# Patient Record
Sex: Male | Born: 1995 | Race: White | Hispanic: No | Marital: Single | Smoking: Never smoker
Health system: Southern US, Community
[De-identification: ages and names within clinical notes are randomized; demographics above are authoritative.]

---
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
## 2019-07-06 DIAGNOSIS — Z20828 Contact with and (suspected) exposure to other viral communicable diseases: Secondary | ICD-10-CM | POA: Diagnosis not present

## 2019-07-20 DIAGNOSIS — Z20828 Contact with and (suspected) exposure to other viral communicable diseases: Secondary | ICD-10-CM | POA: Diagnosis not present

## 2019-10-18 DIAGNOSIS — L700 Acne vulgaris: Secondary | ICD-10-CM | POA: Diagnosis not present

## 2019-10-20 DIAGNOSIS — Z20828 Contact with and (suspected) exposure to other viral communicable diseases: Secondary | ICD-10-CM | POA: Diagnosis not present

## 2019-10-20 DIAGNOSIS — Z6825 Body mass index (BMI) 25.0-25.9, adult: Secondary | ICD-10-CM | POA: Diagnosis not present

## 2019-11-12 ENCOUNTER — Other Ambulatory Visit (HOSPITAL_COMMUNITY)
Admission: RE | Admit: 2019-11-12 | Discharge: 2019-11-12 | Disposition: A | Discharge status: Home / Self Care | Payer: BC Managed Care – PPO | Source: Ambulatory Visit | Attending: Adult Health | Admitting: Adult Health

## 2019-11-12 ENCOUNTER — Encounter: Payer: Self-pay | Admitting: Adult Health

## 2019-11-12 ENCOUNTER — Other Ambulatory Visit: Payer: Self-pay

## 2019-11-12 ENCOUNTER — Ambulatory Visit (INDEPENDENT_AMBULATORY_CARE_PROVIDER_SITE_OTHER): Payer: BC Managed Care – PPO | Admitting: Adult Health

## 2019-11-12 VITALS — BP 118/84 | HR 76 | Temp 97.5°F | Resp 16 | Ht 63.0 in | Wt 147.2 lb

## 2019-11-12 DIAGNOSIS — Z113 Encounter for screening for infections with a predominantly sexual mode of transmission: Secondary | ICD-10-CM | POA: Insufficient documentation

## 2019-11-12 DIAGNOSIS — H04129 Dry eye syndrome of unspecified lacrimal gland: Secondary | ICD-10-CM | POA: Diagnosis not present

## 2019-11-12 DIAGNOSIS — R5383 Other fatigue: Secondary | ICD-10-CM | POA: Diagnosis not present

## 2019-11-12 DIAGNOSIS — Z1329 Encounter for screening for other suspected endocrine disorder: Secondary | ICD-10-CM | POA: Diagnosis not present

## 2019-11-12 NOTE — Patient Instructions (Addendum)
Recommend dilated eye exam - at ophthalmologist list  of your choice. [facility] is in  if needed and dry eyes persist.   Dry Eye  Dry eye, also called keratoconjunctivitis sicca, is dryness of the membranes surrounding the eye. It happens when there are not enough healthy, natural tears in the eyes. The eyes must remain moist at all times. A small amount of tears is constantly produced by the tear glands (lacrimal glands). These glands are located under the outside part of the upper eyelids. Dry eye can happen on its own or be a symptom of several conditions, such as rheumatoid arthritis, lupus, or Sjgren's syndrome. Dry eye may be mild to severe. What are the causes? This condition may be caused by:  Not making enough tears (aqueous tear-deficient dry eyes).  Tears evaporating from the eyes too quickly (evaporative dry eyes). This is when there is an abnormality in the quality of your tears. This abnormality causes your tears to evaporate so quickly that the eyes cannot be kept moist. What increases the risk? You are more likely to develop this condition if you:  Are a woman, especially if you have gone through menopause.  Live in a dry climate.  Live in a dusty or smoky area.  Take certain medicines, such as: ? Anti-allergy medicines (antihistamines). ? Blood pressure medicines (antihypertensives). ? Birth control pills (oral contraceptives). ? Laxatives. ? Tranquilizers.  Have a history of refractive eye surgery, such as LASIK.  Have a history of long-term contact lens use. What are the signs or symptoms? Symptoms of this condition include:  Irritation.  Itchiness.  Redness.  Burning.  Inflammation of the eyelids.  Feeling as though something is stuck in the eye.  Light sensitivity.  Increased sensitivity and discomfort when wearing contact lenses.  Vision that varies throughout the day.  Occasional excessive tearing. How is this  diagnosed? This condition is diagnosed based on your symptoms, your medical history, and an eye exam.  Your health care provider may look at your eye using a microscope and may put dyes in your eye to check the health of the surface of your eye.  You may have tests, such as a test to evaluate your tear production (Schirmer test). ? During this test, a small strip of special paper is gently pressed into the inner corner of your eye. ? Your tear production is measured by how much of the paper is moistened by your tears during a set amount of time. You may be referred to a health care provider who specializes in eyes and eyesight (ophthalmologist). How is this treated? Treatment for this condition depends on the severity. Mild cases are often treated at home. To help relieve your symptoms, your health care provider may recommend eye drops, which are also called artificial tears.  If your condition is severe, treatment may include: ? Prescription eye drops. ? Over-the-counter or prescription ointments to moisten your eyes. ? Minor surgery to place plugs into the tear ducts. This keep tears from draining so that tears can stay on the surface of the eye longer. ? Medicines to reduce inflammation of the eyelids. ? Taking an omega-3 fatty acid nutritional supplement. Follow these instructions at home:  Take or apply over-the-counter and prescription medicines only as told by your health care provider. This includes eye drops.  If directed, apply a warm compress to your eyes to help reduce inflammation. Place a towel over your eyes and gently press the warm compress over your  eyes for about 5 minutes, or as long as told by your health care provider.  Drink plenty of fluids to stay well hydrated.  If possible, avoid dry, drafty environments.  Wear sunglasses when outdoors to protect your eyes from the sun and wind.  Use a humidifier at home to increase moisture in the air.  Remember to blink  often when reading or using the computer for long periods.  If you wear contact lenses, remove them regularly to give your eyes a break. Always remove contacts before sleeping.  Have a yearly eye exam and vision test.  Keep all follow-up visits as told by your health care provider. This is important. Contact a health care provider if:  You have eye pain.  You have pus-like fluid coming from your eye.  Your symptoms get worse or do not improve with treatment. Get help right away if:  Your vision suddenly changes. Summary  Dry eye is dryness of the membranes surrounding the eye.  Dry eye can happen on its own or be a symptom of several conditions, such as rheumatoid arthritis, lupus, or Sjgren's syndrome.  This condition is diagnosed based on your symptoms, your medical history, and an eye exam.  Treatment for this condition depends on the severity. Mild cases are often treated at home. To help relieve your symptoms, your health care provider may recommend eye drops, which are also called artificial tears. This information is not intended to replace advice given to you by your health care provider. Make sure you discuss any questions you have with your health care provider. Document Revised: 04/07/2018 Document Reviewed: 04/07/2018 Elsevier Patient Education  2020 [employer] Artificial Tears eye solution What is this medicine? ARTIFICIAL TEARS (ahr tuh FISH uhl teerz) eye solution soothes irritation and discomfort caused by dry eyes. This medicine may be used for other purposes; ask your health care provider or pharmacist if you have questions. COMMON BRAND NAME(S): Advanced Eye Relief, Akwa Tears, Akwa Tears Renewed, Artificial Tears, Bion Tears, Blink Tears, Clear eyes, Clear eyes Outdoor Dry Eye Protection, FreshKote, GenTeal Mild, GenTeal Moderate, GenTeal PF, Gonak, Goniosoft, Hypo Tears, Isopto Tears, LiquiTears, Lubricating Plus, Moisture Eyes, Moisture Eyes Preservative  Free, Murine, Natural Balance Tears, Nature's Tears, Opti-Free, Puralube Tears, Refresh, Refresh Celluvisc, Refresh Contacts Comfort, Refresh Endura, Refresh Optive, Refresh Optive Sensitive, Refresh Plus, Refresh RELIEVA, Refresh Tears, Retaine CMC, Systane Balance, Systane Complete, Teargen, Tears Naturale Forte, Tears Naturale II, Tears Renewed, TheraTears, Visine Advanced, Visine Dry Eye Relief, Visine Pure Tears, Visine Tears, Visine Tired Eye Relief, Viva What should I tell my health care provider before I take this medicine?  change in vision  eye infection or trauma  wear contact lenses  an unusual or allergic reaction to artificial tears, other medicines, foods, dyes, or preservatives  pregnant or trying to get pregnant  breast-feeding How should I use this medicine? This medicine is only for use in the eye. Do not take by mouth. Follow the directions on the label. Wash hands before and after use. Tilt the head back slightly and pull down the lower eyelid with your index finger to form a pouch. Try not to touch the tip of the dropper to your eye, fingertips, or any other surface. Squeeze the prescribed number of drops (usually one or two drops) into the pouch. Close the eye gently for a few moments to allow the drops to be in contact with the eye. Use your medicine at regular intervals. Do not use your medicine more  often than directed. Talk to your pediatrician regarding the use of this medicine in children. While this medicine may be used in children as young as 6 years for selected conditions, precautions do apply. Overdosage: If you think you have taken too much of this medicine contact a poison control center or emergency room at once. NOTE: This medicine is only for you. Do not share this medicine with others. What if I miss a dose? If you miss a dose, use it as soon as you can. If it is almost time for your next dose, use only that dose. Do not use double or extra doses. What may  interact with this medicine? Interactions are not expected. If you are using other eye drops with this medicine, separate the application of the different eye drops by roughly 5 minutes. This ensures that the eye drops do not interfere with each other. If you are using both eye drops and an eye ointment, use the eye drops 10 minutes before the eye ointment so that the eye ointment does not interfere with the action of the drops. This list may not describe all possible interactions. Give your health care provider a list of all the medicines, herbs, non-prescription drugs, or dietary supplements you use. Also tell them if you smoke, drink alcohol, or use illegal drugs. Some items may interact with your medicine. What should I watch for while using this medicine? If you experience eye pain, changes in vision, continued redness or irritation of the eye, or if your eye condition gets worse or lasts longer than 72 hours, discontinue use and consult your health care professional. To avoid contamination of this product, do not touch the tip of the container to any surface. Do not share this medicine with others. If the product changes color or becomes cloudy, do not use. If you wear contact lenses, you should remove them before putting the drops in your eyes. Wait at least 15 minutes after putting the drops in your eyes before putting your contact lenses back in. What side effects may I notice from receiving this medicine? Side effects that you should report to your doctor or health care professional as soon as possible:  allergic reactions like skin rash, itching or hives, swelling of the face, lips, or tongue  change in vision  eye irritation or redness that gets worse or lasts more than 72 hours  eye pain Side effects that usually do not require medical attention (report to your doctor or health care professional if they continue or are bothersome):  temporary stinging or blurred vision when applying  the eye drops This list may not describe all possible side effects. Call your doctor for medical advice about side effects. You may report side effects to FDA at 1-800-FDA-1088. Where should I keep my medicine? Keep out of the reach of children. Store at room temperature between 15 and 30 degrees C (59 and 86 degrees F). Do not freeze. Throw away any unused medicine after the expiration date. Once the product is opened, most experts recommend discarding the product after 30 days. NOTE: This sheet is a summary. It may not cover all possible information. If you have questions about this medicine, talk to your doctor, pharmacist, or health care provider.  2020 Elsevier/Gold Standard (2008-04-15 14:24:03) Health Maintenance, Male Adopting a healthy lifestyle and getting preventive care are important in promoting health and wellness. Ask your health care provider about:  The right schedule for you to have regular tests and exams.  Things you can do on your own to prevent diseases and keep yourself healthy. What should I know about diet, weight, and exercise? Eat a healthy diet   Eat a diet that includes plenty of vegetables, fruits, low-fat dairy products, and lean protein.  Do not eat a lot of foods that are high in solid fats, added sugars, or sodium. Maintain a healthy weight Body mass index (BMI) is a measurement that can be used to identify possible weight problems. It estimates body fat based on height and weight. Your health care provider can help determine your BMI and help you achieve or maintain a healthy weight. Get regular exercise Get regular exercise. This is one of the most important things you can do for your health. Most adults should:  Exercise for at least 150 minutes each week. The exercise should increase your heart rate and make you sweat (moderate-intensity exercise).  Do strengthening exercises at least twice a week. This is in addition to the moderate-intensity  exercise.  Spend less time sitting. Even light physical activity can be beneficial. Watch cholesterol and blood lipids Have your blood tested for lipids and cholesterol at 24 years of age, then have this test every 5 years. You may need to have your cholesterol levels checked more often if:  Your lipid or cholesterol levels are high.  You are older than 24 years of age.  You are at high risk for heart disease. What should I know about cancer screening? Many types of cancers can be detected early and may often be prevented. Depending on your health history and family history, you may need to have cancer screening at various ages. This may include screening for:  Colorectal cancer.  Prostate cancer.  Skin cancer.  Lung cancer. What should I know about heart disease, diabetes, and high blood pressure? Blood pressure and heart disease  High blood pressure causes heart disease and increases the risk of stroke. This is more likely to develop in people who have high blood pressure readings, are of African descent, or are overweight.  Talk with your health care provider about your target blood pressure readings.  Have your blood pressure checked: ? Every 3-5 years if you are 3618-24 years of age. ? Every year if you are 24 years old or older.  If you are between the ages of 3865 and 6675 and are a current or former smoker, ask your health care provider if you should have a one-time screening for abdominal aortic aneurysm (AAA). Diabetes Have regular diabetes screenings. This checks your fasting blood sugar level. Have the screening done:  Once every three years after age 24 if you are at a normal weight and have a low risk for diabetes.  More often and at a younger age if you are overweight or have a high risk for diabetes. What should I know about preventing infection? Hepatitis B If you have a higher risk for hepatitis B, you should be screened for this virus. Talk with your health care  provider to find out if you are at risk for hepatitis B infection. Hepatitis C Blood testing is recommended for:  Everyone born from [id number] through 1965.  Anyone with known risk factors for hepatitis C. Sexually transmitted infections (STIs)  You should be screened each year for STIs, including gonorrhea and chlamydia, if: ? You are sexually active and are younger than 24 years of age. ? You are older than 24 years of age and your health care provider tells you  that you are at risk for this type of infection. ? Your sexual activity has changed since you were last screened, and you are at increased risk for chlamydia or gonorrhea. Ask your health care provider if you are at risk.  Ask your health care provider about whether you are at high risk for HIV. Your health care provider may recommend a prescription medicine to help prevent HIV infection. If you choose to take medicine to prevent HIV, you should first get tested for HIV. You should then be tested every 3 months for as long as you are taking the medicine. Follow these instructions at home: Lifestyle  Do not use any products that contain nicotine or tobacco, such as cigarettes, e-cigarettes, and chewing tobacco. If you need help quitting, ask your health care provider.  Do not use street drugs.  Do not share needles.  Ask your health care provider for help if you need support or information about quitting drugs. Alcohol use  Do not drink alcohol if your health care provider tells you not to drink.  If you drink alcohol: ? Limit how much you have to 0-2 drinks a day. ? Be aware of how much alcohol is in your drink. In the [place] one drink equals one 12 oz bottle of beer (355 mL), one 5 oz glass of wine (148 mL), or one 1 oz glass of hard liquor (44 mL). General instructions  Schedule regular health, dental, and eye exams.  Stay current with your vaccines.  Tell your health care provider if: ? You often feel depressed. ? You  have ever been abused or do not feel safe at home. Summary  Adopting a healthy lifestyle and getting preventive care are important in promoting health and wellness.  Follow your health care provider's instructions about healthy diet, exercising, and getting tested or screened for diseases.  Follow your health care provider's instructions on monitoring your cholesterol and blood pressure. This information is not intended to replace advice given to you by your health care provider. Make sure you discuss any questions you have with your health care provider. Document Revised: 10/07/2018 Document Reviewed: 10/07/2018 Elsevier Patient Education  [place]. Testicular Self-Exam A self-exam of your testicles (testicular self-exam) is looking at and feeling your testicles for unusual lumps or swelling. Swelling, lumps, or pain can be caused by:  Injuries.  Puffiness, redness, and soreness (inflammation).  Infection.  Extra fluids around your testicle (hydrocele).  Twisted testicles (testicular torsion).  Cancer of the testicle (testicular cancer). Why is it important to do a self-exam of testicles? You may need to do self-exams if you are at risk for cancer of the testicles. You may be at risk if you have:  A testicle that has not descended (cryptorchidism).  A history of cancer of the testicle.  A family history of cancer of the testicle. How to do a self-exam of testicles It is easiest to do a self-exam after a warm bath or shower. Testicles are harder to examine when you are cold. A normal testicle is egg-shaped and feels firm. It is smooth, and it is not tender. At the back of your testicles, there is a firm cord that feels like spaghetti (spermatic cord). Look and feel for changes  Stand and hold your penis away from your body.  Look at each testicle to check for lumps or swelling.  Roll each testicle between your thumb and finger. Feel the whole testicle. Feel  for: ? Lumps. ? Swelling. ?  Discomfort.  Check for swelling or tender bumps in the groin area. Your groin is where your lower belly (abdomen) meets your upper thighs. Contact a health care provider if:  You find a bump or lump. This may be like a small, hard bump that is the size of a pea.  You find swelling.  You find pain.  You find soreness.  You see or feel any other changes. Summary  A self-exam of your testicles is looking at and feeling your testicles for lumps or swelling.  You may need to do self-exams if you are at risk for cancer of the testicle.  You should check each of your testicles for lumps, swelling, or discomfort.  You should check for swelling or tender bumps in the groin area. Your groin is where your lower belly (abdomen) meets your upper thighs. This information is not intended to replace advice given to you by your health care provider. Make sure you discuss any questions you have with your health care provider. Document Revised: 02/04/2019 Document Reviewed: 09/09/2016 Elsevier Patient Education  2020 [employer].

## 2019-11-12 NOTE — Progress Notes (Signed)
Patient: Christopher Lyons, Male    DOB: 12-17-1995, 24 y.o.   MRN: [id number] Visit Date: 11/12/2019  Today's Provider: Jairo Ben, FNP   Chief Complaint  Patient presents with  . New Patient (Initial Visit)   Subjective:    New patient Christopher Lyons is a 24 y.o. male who presents today for health maintenance and establish care He feels well, patient would like to address today intermittent eye pain that is affecting him at night. Patient states that he looks at a computer screen for long periods of time and recently switched to blue light to see if it would help with his eyes but he reports pain still is present after prolonged time on computer.Marland Kitchen He reports exercising. He reports he is sleeping well, patient did state that if he does not get exactly 8 hrs of sleep a night then he will experience fatigue in the following day and patient wanted to discuss why he was experiencing this.   He reports with 6-7 hours of sleep he is too tired he has to have 8 hours and feels good. Goes to bed 9pm and wakes at 5 am. He reports he started drinking coffee and this has helped.   He has had "burning " feeling his eyes mostly when looking  at phones/ screens. . Does not wear contacts or glasses. He has not been seen by eye doctor. He has not tried any treatments. He is unable to reduce screen time. He did purchase blue blocker glasses and switched to night mode on devices. Denies any drainage, pain , vision change. Denies itching eyes.  He denies any eye injury or trauma.  Denies any unusual sensations in his eyes.  Denies grittiness.  He is an active runner, he eats well. He is very active. Tries to maintain a healthy diet , smoothies, salads and small meals daily. Runs 5 x week at least.   Increased urination, has increased fluids and started caffeine recently and noticed this after starting that.  Denies any dysuria or symptoms.  Denies hematuria.  History was reviewed with patient, he  does have history of bilateral lower calf pains but that was with running, this has resolved.  He was seen previously years ago for this.  Denies any pain. He would like to be tested for STD's.  He denies any specific exposures or concerns.  Denies any symptoms.  He works in Airline pilot at [employer]. He manages his stress well he reports.  Otherwise he feels well, is happy.  Denies any suicidal or homicidal ideations or intents.  Patient  denies any fever, body aches,chills, rash, chest pain, shortness of breath, nausea, vomiting, or diarrhea.   -----------------------------------------------------------------   Review of Systems  Constitutional: Positive for fatigue (when not getting exactly 8 hours of sleep. now drinking two cups of caffiene daily and feels less fatigue. ). Negative for activity change, appetite change, chills, diaphoresis, fever and unexpected weight change.       Did test positive for covid in 2019 September.   HENT: Negative.   Eyes: Negative for photophobia, pain, discharge, redness, itching and visual disturbance.       Dryness bilaterally. Has not tried any treatment. Has bought blue blocker glasses . He has lots of screen time at work.  Never has had an eye exam he reports. He does not have any reported vision change, or pain in eyes.   Respiratory: Negative.   Cardiovascular: Negative.   Gastrointestinal: Negative.  Endocrine: Positive for polyuria. Negative for cold intolerance, heat intolerance, polydipsia and polyphagia.  Genitourinary: Negative.   Musculoskeletal: Negative.   Skin: Negative.   Allergic/Immunologic: Positive for environmental allergies. Negative for food allergies and immunocompromised state.  Neurological: Negative.  Negative for dizziness, tremors, seizures, syncope, facial asymmetry, speech difficulty, weakness, light-headedness, numbness and headaches.  Hematological: Negative.   Psychiatric/Behavioral: Negative.     Social History He   Social  History   Socioeconomic History  . Marital status: Single    Spouse name: Not on file  . Number of children: Not on file  . Years of education: Not on file  . Highest education level: Not on file  Occupational History  . Not on file  Tobacco Use  . Smoking status: Not on file  Substance and Sexual Activity  . Alcohol use: Not on file  . Drug use: Not on file  . Sexual activity: Not on file  Other Topics Concern  . Not on file  Social History Narrative  . Not on file   Social Determinants of Health   Financial Resource Strain:   . Difficulty of Paying Living Expenses: Not on file  Food Insecurity:   . Worried About Programme researcher, broadcasting/film/videounning Out of Food in the Last Year: Not on file  . Ran Out of Food in the Last Year: Not on file  Transportation Needs:   . Lack of Transportation (Medical): Not on file  . Lack of Transportation (Non-Medical): Not on file  Physical Activity:   . Days of Exercise per Week: Not on file  . Minutes of Exercise per Session: Not on file  Stress:   . Feeling of Stress : Not on file  Social Connections:   . Frequency of Communication with Friends and Family: Not on file  . Frequency of Social Gatherings with Friends and Family: Not on file  . Attends Religious Services: Not on file  . Active Member of Clubs or Organizations: Not on file  . Attends BankerClub or Organization Meetings: Not on file  . Marital Status: Not on file   Patient Active Problem List   Diagnosis Date Noted  . Fatigue 11/12/2019  . Screening for thyroid disorder 11/12/2019  . Eye dryness 11/12/2019  . Peroneus brevis tendinitis, right 11/30/2014  . Right foot pain 11/30/2014  . Muscle tightness 07/06/2014  . Bilateral calf pain 07/06/2014  . Decreased range of motion of ankle 07/06/2014    Family History  No family status information on file.   His family history is not on file.     No Known Allergies  Previous Medications   No medications on file    Patient Care Team: Flinchum,  Eula FriedMichelle S, FNP as PCP - General (Family Medicine)      Objective:    Physical Exam Vitals and nursing note reviewed.  Constitutional:      General: He is not in acute distress.    Appearance: Normal appearance. He is well-developed and normal weight. He is not ill-appearing, toxic-appearing or diaphoretic.     Comments: Patient is alert and oriented and responsive to questions Engages in eye contact with provider. Speaks in full sentences without any pauses without any shortness of breath or distress.    HENT:     Head: Normocephalic and atraumatic.     Right Ear: Hearing, tympanic membrane, ear canal and external ear normal.     Left Ear: Hearing, tympanic membrane, ear canal and external ear normal.     Nose:  Nose normal.     Mouth/Throat:     Pharynx: Uvula midline. No oropharyngeal exudate.  Eyes:     General: Lids are normal. Lids are everted, no foreign bodies appreciated. Vision grossly intact. Gaze aligned appropriately. No allergic shiner, visual field deficit or scleral icterus.       Right eye: No discharge.        Left eye: No discharge.     Extraocular Movements: Extraocular movements intact.     Conjunctiva/sclera: Conjunctivae normal.     Right eye: Right conjunctiva is not injected. No chemosis, exudate or hemorrhage.    Left eye: Left conjunctiva is not injected. No chemosis, exudate or hemorrhage.    Pupils: Pupils are equal, round, and reactive to light.     Right eye: Pupil is round, reactive and not sluggish.     Left eye: Pupil is round, reactive and not sluggish.     Funduscopic exam:    Right eye: No hemorrhage, exudate, AV nicking or papilledema. Red reflex present.        Left eye: No hemorrhage, exudate or papilledema. Red reflex and venous pulsations present.    Slit lamp exam:    Right eye: No photophobia.     Left eye: No photophobia.     Comments: Vision exam showed 20/15   Neck:     Thyroid: No thyromegaly.     Vascular: Normal carotid pulses.  No carotid bruit, hepatojugular reflux or JVD.     Trachea: Trachea and phonation normal. No tracheal tenderness or tracheal deviation.     Meningeal: Brudzinski's sign absent.  Cardiovascular:     Rate and Rhythm: Normal rate and regular rhythm.     Pulses: Normal pulses.     Heart sounds: Normal heart sounds, S1 normal and S2 normal. Heart sounds not distant. No murmur. No friction rub. No gallop.   Pulmonary:     Effort: Pulmonary effort is normal. No accessory muscle usage or respiratory distress.     Breath sounds: Normal breath sounds. No stridor. No wheezing or rales.  Chest:     Chest wall: No tenderness.  Abdominal:     General: Bowel sounds are normal. There is no distension.     Palpations: Abdomen is soft. There is no mass.     Tenderness: There is no abdominal tenderness. There is no right CVA tenderness, left CVA tenderness, guarding or rebound.     Hernia: No hernia is present.  Genitourinary:    Comments: Deferred declined, patient has no symptoms or concerns. Musculoskeletal:        General: No tenderness or deformity. Normal range of motion.     Cervical back: Full passive range of motion without pain, normal range of motion and neck supple.     Comments: Patient moves on and off of exam table and in room without difficulty. Gait is normal in hall and in room. Patient is oriented to person place time and situation. Patient answers questions appropriately and engages in conversation.   Lymphadenopathy:     Head:     Right side of head: No submental, submandibular, tonsillar, preauricular, posterior auricular or occipital adenopathy.     Left side of head: No submental, submandibular, tonsillar, preauricular, posterior auricular or occipital adenopathy.     Cervical: No cervical adenopathy.  Skin:    General: Skin is warm and dry.     Capillary Refill: Capillary refill takes less than 2 seconds.     Coloration: Skin is  not pale.     Findings: No erythema or rash.      Nails: There is no clubbing.  Neurological:     Mental Status: He is alert and oriented to person, place, and time.     GCS: GCS eye subscore is 4. GCS verbal subscore is 5. GCS motor subscore is 6.     Cranial Nerves: No cranial nerve deficit.     Sensory: No sensory deficit.     Motor: No abnormal muscle tone.     Coordination: Coordination normal.     Gait: Gait normal.     Deep Tendon Reflexes: Reflexes are normal and symmetric. Reflexes normal.  Psychiatric:        Speech: Speech normal.        Behavior: Behavior normal.        Thought Content: Thought content normal.        Judgment: Judgment normal.      Depression Screen PHQ 2/9 Scores 11/12/2019  PHQ - 2 Score 0  PHQ- 9 Score 4    Depression screen PHQ 2/9 11/12/2019  Decreased Interest 0  Down, Depressed, Hopeless 0  PHQ - 2 Score 0  Altered sleeping 2  Tired, decreased energy 2  Change in appetite 0  Feeling bad or failure about yourself  0  Trouble concentrating 0  Moving slowly or fidgety/restless 0  Suicidal thoughts 0  PHQ-9 Score 4  Difficult doing work/chores Not difficult at all     Assessment & Plan:     Routine Health Maintenance and Physical Exam  Exercise Activities and Dietary recommendations Goals   None      There is no immunization history on file for this patient.  Health Maintenance  Topic Date Due  . HIV Screening  09/06/2011  . TETANUS/TDAP  09/06/2015  . INFLUENZA VACCINE  05/29/2019   Yearly eye exam and dental exam recommended.   Discussed health benefits of physical activity, and encouraged him to engage in regular exercise appropriate for his age and condition.    Fatigue, unspecified type - Plan: HIV screening, Urine cytology ancillary only, CBC with Differential/Platelet 005009, Comprehensive Metabolic Panel (CMET), TSH  Screening for STD (sexually transmitted disease) - Plan: HIV screening, RPR, Urine cytology ancillary only, TSH  Screening for thyroid disorder -  Plan: TSH  Eye dryness - Plan: CBC with Differential/Platelet 005009, Comprehensive Metabolic Panel (CMET), TSH  Recommend artificial tears/ lubrication eye drops per package instructions. Eye exam at ophthalmologist. Discussed RED FLAGS of eye pain, vision change or loss and when to seek care immediately. Decrease screen time if able. He works on Arts administrator a lot.  Screening for UTI's.  Discussed diuretic effect of caffeine. Also discussed urinary signs to return to clinic for.  Will check labs for fatigue.  Recommend sleep hygiene.  Recommend  Yearly physical and return PRN.   Return if symptoms worsen or fail to improve, for at any time for any worsening symptoms, Go to Emergency room/ urgent care if worse. Advised patient call the office or your primary care doctor for an appointment if no improvement within 72 hours or if any symptoms change or worsen at any time  Advised ER or urgent Care if after hours or on weekend. Call 911 for emergency symptoms at any time.Patinet verbalized understanding of all instructions given/reviewed and treatment plan and has no further questions or concerns at this time.    The entirety of the information documented in the History of Present Illness, Review  of Systems and Physical Exam were personally obtained by me. Portions of this information were initially documented by the  Certified Medical Assistant whose name is documented in Epic and reviewed by me for thoroughness and accuracy.  I have personally performed the exam and reviewed the chart and it is accurate to the best of my knowledge.  Museum/gallery conservator has been used and any errors in dictation or transcription are unintentional.  Eula Fried. Flinchum FNP-C  [facility]  --------------------------------------------------------------------

## 2019-11-13 ENCOUNTER — Encounter: Payer: Self-pay | Admitting: Adult Health

## 2019-11-14 ENCOUNTER — Encounter: Payer: Self-pay | Admitting: Adult Health

## 2019-11-15 ENCOUNTER — Other Ambulatory Visit: Payer: Self-pay

## 2019-11-15 DIAGNOSIS — Z1329 Encounter for screening for other suspected endocrine disorder: Secondary | ICD-10-CM

## 2019-11-15 DIAGNOSIS — R5383 Other fatigue: Secondary | ICD-10-CM

## 2019-11-15 DIAGNOSIS — H04129 Dry eye syndrome of unspecified lacrimal gland: Secondary | ICD-10-CM

## 2019-11-15 DIAGNOSIS — Z113 Encounter for screening for infections with a predominantly sexual mode of transmission: Secondary | ICD-10-CM

## 2019-11-16 ENCOUNTER — Encounter: Payer: Self-pay | Admitting: Adult Health

## 2019-11-16 LAB — URINE CYTOLOGY ANCILLARY ONLY
Chlamydia: NEGATIVE
Comment: NEGATIVE
Comment: NEGATIVE
Comment: NORMAL
Neisseria Gonorrhea: NEGATIVE
Trichomonas: NEGATIVE

## 2019-11-16 LAB — COMPREHENSIVE METABOLIC PANEL
ALT: 44 IU/L (ref 0–44)
AST: 46 IU/L — ABNORMAL HIGH (ref 0–40)
Albumin/Globulin Ratio: 1.9 (ref 1.2–2.2)
Albumin: 4.8 g/dL (ref 4.1–5.2)
Alkaline Phosphatase: 56 IU/L (ref 39–117)
BUN/Creatinine Ratio: 9 (ref 9–20)
BUN: 11 mg/dL (ref 6–20)
Bilirubin Total: 0.6 mg/dL (ref 0.0–1.2)
CO2: 25 mmol/L (ref 20–29)
Calcium: 9.6 mg/dL (ref 8.7–10.2)
Chloride: 103 mmol/L (ref 96–106)
Creatinine, Ser: 1.19 mg/dL (ref 0.76–1.27)
GFR calc Af Amer: 99 mL/min/{1.73_m2} (ref >59)
GFR calc non Af Amer: 86 mL/min/{1.73_m2} (ref >59)
Globulin, Total: 2.5 g/dL (ref 1.5–4.5)
Glucose: 93 mg/dL (ref 65–99)
Potassium: 4.4 mmol/L (ref 3.5–5.2)
Sodium: 139 mmol/L (ref 134–144)
Total Protein: 7.3 g/dL (ref 6.0–8.5)

## 2019-11-16 LAB — CBC WITH DIFFERENTIAL/PLATELET
Basophils Absolute: 0.1 10*3/uL (ref 0.0–0.2)
Basos: 2 %
EOS (ABSOLUTE): 0 10*3/uL (ref 0.0–0.4)
Eos: 0 %
Hematocrit: 43.5 % (ref 37.5–51.0)
Hemoglobin: 14.9 g/dL (ref 13.0–17.7)
Immature Grans (Abs): 0 10*3/uL (ref 0.0–0.1)
Immature Granulocytes: 0 %
Lymphocytes Absolute: 3.1 10*3/uL (ref 0.7–3.1)
Lymphs: 58 %
MCH: 30 pg (ref 26.6–33.0)
MCHC: 34.3 g/dL (ref 31.5–35.7)
MCV: 88 fL (ref 79–97)
Monocytes Absolute: 0.4 10*3/uL (ref 0.1–0.9)
Monocytes: 8 %
Neutrophils Absolute: 1.7 10*3/uL (ref 1.4–7.0)
Neutrophils: 32 %
Platelets: 150 10*3/uL (ref 150–450)
RBC: 4.96 x10E6/uL (ref 4.14–5.80)
RDW: 12.2 % (ref 11.6–15.4)
WBC: 5.3 10*3/uL (ref 3.4–10.8)

## 2019-11-16 LAB — RPR: RPR Ser Ql: NONREACTIVE

## 2019-11-16 LAB — HIV ANTIBODY (ROUTINE TESTING W REFLEX): HIV Screen 4th Generation wRfx: NONREACTIVE

## 2019-11-16 LAB — TSH: TSH: 1.84 u[IU]/mL (ref 0.450–4.500)

## 2019-11-17 ENCOUNTER — Ambulatory Visit: Payer: BC Managed Care – PPO | Attending: Internal Medicine

## 2019-11-17 DIAGNOSIS — Z20822 Contact with and (suspected) exposure to covid-19: Secondary | ICD-10-CM

## 2019-11-18 LAB — NOVEL CORONAVIRUS, NAA: SARS-CoV-2, NAA: NOT DETECTED

## 2019-11-20 DIAGNOSIS — B2780 Other infectious mononucleosis without complication: Secondary | ICD-10-CM | POA: Diagnosis not present

## 2019-11-22 ENCOUNTER — Telehealth: Payer: Self-pay | Admitting: Adult Health

## 2019-11-22 ENCOUNTER — Telehealth: Payer: Self-pay

## 2019-11-22 NOTE — Telephone Encounter (Signed)
Copied from CRM [id number]. Topic: General - Other >> Nov 22, 2019 11:43 AM Herby Abraham C wrote: Reason for CRM: pt called in returning [facility] call. Please assist.

## 2019-11-22 NOTE — Telephone Encounter (Signed)
Left message for patient to call back to get clarification. I see a mychart message in chart with you and patient on 11/16/19 where he mentioned a sore throat. KW

## 2019-11-22 NOTE — Telephone Encounter (Signed)
Verify no throat swelling, or trouble swallowing, high fever or any emergent symptom.  If so needs urgent evaluation now at UC/ ED.  If  Ok he needs evaluation 11/23/2019 schedules  prior to medications. He can try over the counter throat lozenges/ throat spray. Tylenol or Motrin per package instructions. Warm salt water gargles until visit.  If he feels needs to be seen earlier he may go to UC.

## 2019-11-22 NOTE — Telephone Encounter (Signed)
Copied from CRM [id number]. Topic: General - Other >> Nov 22, 2019  8:39 AM Tamela Oddi wrote: Reason for CRM: Patient would some medication for his throat before his appt.  Please advise and call back at [phone]

## 2019-11-22 NOTE — Telephone Encounter (Signed)
Patient stats that he was diagnosed with Mono at [facility] 2 days ago. Patient stats that he was told to take otc Tylenol or Ibuprofen. Patient states that his throat is very sore and states that his tonsils are swollen to the point that it is touching, patient reports that he has yellow and white mucous in the back of his throat. Patient reports pain with swallowing and states sleeping at night has become more difficulty. KW

## 2019-11-23 ENCOUNTER — Encounter: Payer: Self-pay | Admitting: Adult Health

## 2019-11-23 ENCOUNTER — Telehealth (INDEPENDENT_AMBULATORY_CARE_PROVIDER_SITE_OTHER): Payer: BC Managed Care – PPO | Admitting: Adult Health

## 2019-11-23 VITALS — Temp 98.2°F

## 2019-11-23 DIAGNOSIS — Z8619 Personal history of other infectious and parasitic diseases: Secondary | ICD-10-CM | POA: Diagnosis not present

## 2019-11-23 DIAGNOSIS — J039 Acute tonsillitis, unspecified: Secondary | ICD-10-CM

## 2019-11-23 DIAGNOSIS — R070 Pain in throat: Secondary | ICD-10-CM

## 2019-11-23 MED ORDER — CLINDAMYCIN HCL 300 MG PO CAPS: 300.0000 mg | ORAL_CAPSULE | Freq: Three times a day (TID) | ORAL | 0 refills | Status: DC

## 2019-11-23 MED ORDER — PREDNISONE 10 MG (21) PO TBPK: ORAL_TABLET | ORAL | 0 refills | Status: DC

## 2019-11-23 NOTE — Telephone Encounter (Signed)
Visit completed.

## 2019-11-23 NOTE — Progress Notes (Signed)
Patient: Christopher Lyons Male    DOB: Mar 24, 1996   24 y.o.   MRN: [id number] Visit Date: 11/23/2019  Today's Provider: Marcille Buffy, FNP   Chief Complaint  Patient presents with  . Fever   Subjective:    Virtual Visit via Video Note  I connected with Gared Josephs on 11/23/19 at  1:20 PM EST by a video enabled telemedicine application and verified that I am speaking with the correct person using two identifiers.  Location: Patient: at home  Provider: Provider: Provider's office at  [facility], [place].      I discussed the limitations of evaluation and management by telemedicine and the availability of in person appointments. The patient expressed understanding and agreed to proceed.   I discussed the assessment and treatment plan with the patient. The patient was provided an opportunity to ask questions and all were answered. The patient agreed with the plan and demonstrated an understanding of the instructions.   The patient was advised to call back or seek an in-person evaluation if the symptoms worsen or if the condition fails to improve as anticipated.    Fever  This is a new problem. The current episode started in the past 7 days (Patient reports that he recently tested positive for mono). The problem occurs daily. The problem has been waxing and waning. The temperature was taken using an oral thermometer. Associated symptoms include sleepiness and a sore throat (patient reports tonsils are swollen and reports difficulty swallowing and difficulty sleeping do to pain). Pertinent negatives include no abdominal pain, chest pain, congestion, coughing, diarrhea, ear pain, headaches, muscle aches, nausea, rash, urinary pain, vomiting or wheezing. He has tried acetaminophen and NSAIDs for the symptoms. The treatment provided no relief.  Risk factors: recent sickness    He went to [facility] on Saturday and was diagnosed with Mono on 11/20/2019 he  reports blood test  Very painful swallowing. He has been taking 1,000 mg tylenol every 8 hours and Ibuprofen 800mg  motrin alternating for pain. He is able to swallow liquid and food.   Denies any abdominal pain.  He has not taken any medications other than the over the counters mentioned above. He denies any prescriptions from [facility].  He does report having a fine red  rash on his body that he reports is barely visible but he can feel it as well. He has bilateral white/ yellow reported on his tonsils and has tonsil enlargement.  Temperature today is 100.9 oral.   Patient  denies any  body aches,chills, chest pain, shortness of breath, nausea, vomiting,abdominal pain  or diarrhea.    No Known Allergies  No current outpatient medications on file.  Review of Systems  Constitutional: Positive for fever.  HENT: Positive for sore throat (patient reports tonsils are swollen and reports difficulty swallowing and difficulty sleeping do to pain). Negative for congestion and ear pain.   Respiratory: Negative for cough and wheezing.   Cardiovascular: Negative for chest pain.  Gastrointestinal: Negative for abdominal pain, diarrhea, nausea and vomiting.  Genitourinary: Negative for dysuria.  Skin: Negative for rash.  Neurological: Negative for headaches.    Social History   Tobacco Use  . Smoking status: Never Smoker  . Smokeless tobacco: Never Used  Substance Use Topics  . Alcohol use: Not on file      Objective:   Temp 98.2 F (36.8 C) (Oral) Comment: patient reports Vitals:   11/23/19 1105  Temp: 98.2  F (36.8 C)  TempSrc: Oral  There is no height or weight on file to calculate BMI.   Physical Exam   Patient is alert and oriented and responsive to questions Engages in conversation with provider. Speaks in full sentences without any pauses without any shortness of breath or distress.  Picture was sent to Mychart as well.   No results found for any visits on 11/23/19.       Assessment & Plan     Acute tonsillitis, unspecified etiology  History of mononucleosis  Throat pain  After visit summary, care instructions given there.  Formational medications also provided and discussed.  Discussed side effects of clindamycin.  Avoiding amoxicillin/Augmentin and azithromycin due to history of mono.  Not able to see [facility] note from patient's visit.  Will cover bacterial etiology given patient is unable to be seen in the office for a throat swab due to COVID-19 pandemic. Meds ordered this encounter  Medications  . predniSONE (STERAPRED UNI-PAK 21 TAB) 10 MG (21) TBPK tablet    Sig: PO: Take 6 tablets on day 1:Take 5 tablets day 2:Take 4 tablets day 3: Take 3 tablets day 4:Take 2 tablets day five: 5 Take 1 tablet day 6    Dispense:  21 tablet    Refill:  0  . clindamycin (CLEOCIN) 300 MG capsule    Sig: Take 1 capsule (300 mg total) by mouth 3 (three) times daily.    Dispense:  30 capsule    Refill:  0   Avoid NSAIDs while on prednisone, may take Tylenol 1000 mg every 6 hours as needed for throat pain. Discussed red flags with patient and when to seek treatment immediately.  Discussed abdominal pain and spleen pain if this occurs to report to provider, or seek after-hours treatment.  Patient verbalizes understanding and will be seen in person at urgent care if symptoms change or worsen.  Follow-up as recommended.  Advised patient call the office or your primary care doctor for an appointment if no improvement within 72 hours or if any symptoms change or worsen at any time  Advised ER or urgent Care if after hours or on weekend. Call 911 for emergency symptoms at any time.Patinet verbalized understanding of all instructions given/reviewed and treatment plan and has no further questions or concerns at this time.     I provided 20  minutes of non-face-to-face time during this encounter.  The entirety of the information documented in the History of Present Illness,  Review of Systems and Physical Exam were personally obtained by me. Portions of this information were initially documented by the  Certified Medical Assistant whose name is documented in Epic and reviewed by me for thoroughness and accuracy.  I have personally performed the exam and reviewed the chart and it is accurate to the best of my knowledge.  Museum/gallery conservator has been used and any errors in dictation or transcription are unintentional.  Eula Fried. Flinchum FNP-C  [facility]  Jairo Ben, FNP  [facility]

## 2019-11-23 NOTE — Patient Instructions (Signed)
Warm salt water gargles as needed. Mix liquid benadryl and liquid antacid such as Maalox together half and half, gargle, hold in mouth as long as can and spit up to three times daily.  May use over the counter throat lozenges or throat spray for throat pain. Avoid Ibuprofen, aleve and motrin while on Prednisone. May use Tylenol extra strength 500 mg take two tablets by mouth every 6 hours as needed for pain.   Advised patient call the office or your primary care doctor for an appointment if no improvement within 72 hours or if any symptoms change or worsen at any time  Advised ER or urgent Care if after hours or on weekend. Call 911 for emergency symptoms at any time.Patinet verbalized understanding of all instructions given/reviewed and treatment plan and has no further questions or concerns at this time.      Prednisolone tablets What is this medicine? PREDNISOLONE (pred NISS oh lone) is a corticosteroid. It is commonly used to treat inflammation of the skin, joints, lungs, and other organs. Common conditions treated include asthma, allergies, and arthritis. It is also used for other conditions, such as blood disorders and diseases of the adrenal glands. This medicine may be used for other purposes; ask your health care provider or pharmacist if you have questions. COMMON BRAND NAME(S): Millipred, Millipred DP, Millipred DP 12-Day, Millipred DP 6 Day, Prednoral What should I tell my health care provider before I take this medicine? They need to know if you have any of these conditions:  Cushing's syndrome  diabetes  glaucoma  heart problems or disease  high blood pressure  infection such as herpes, measles, tuberculosis, or chickenpox  kidney disease  liver disease  mental problems  myasthenia gravis  osteoporosis  seizures  stomach ulcer or intestine disease including colitis and diverticulitis  thyroid problem  an unusual or allergic reaction to lactose, prednisolone,  other medicines, foods, dyes, or preservatives  pregnant or trying to get pregnant  breast-feeding How should I use this medicine? Take this medicine by mouth with a glass of water. Follow the directions on the prescription label. Take it with food or milk to avoid stomach upset. If you are taking this medicine once a day, take it in the morning. Do not take more medicine than you are told to take. Do not suddenly stop taking your medicine because you may develop a severe reaction. Your doctor will tell you how much medicine to take. If your doctor wants you to stop the medicine, the dose may be slowly lowered over time to avoid any side effects. Talk to your pediatrician regarding the use of this medicine in children. Special care may be needed. Overdosage: If you think you have taken too much of this medicine contact a poison control center or emergency room at once. NOTE: This medicine is only for you. Do not share this medicine with others. What if I miss a dose? If you miss a dose, take it as soon as you can. If it is almost time for your next dose, take only that dose. Do not take double or extra doses. What may interact with this medicine? Do not take this medicine with any of the following medications:  metyrapone  mifepristone This medicine may also interact with the following medications:  aminoglutethimide  amphotericin B  aspirin and aspirin-like medicines  barbiturates  certain medicines for diabetes, like glipizide or glyburide  cholestyramine  cholinesterase inhibitors  cyclosporine  digoxin  diuretics  ephedrine  male hormones, like estrogens and birth control pills  isoniazid  ketoconazole  NSAIDS, medicines for pain and inflammation, like ibuprofen or naproxen  phenytoin  rifampin  toxoids  vaccines  warfarin This list may not describe all possible interactions. Give your health care provider a list of all the medicines, herbs,  non-prescription drugs, or dietary supplements you use. Also tell them if you smoke, drink alcohol, or use illegal drugs. Some items may interact with your medicine. What should I watch for while using this medicine? Visit your doctor or health care professional for regular checks on your progress. If you are taking this medicine over a prolonged period, carry an identification card with your name and address, the type and dose of your medicine, and your doctor's name and address. This medicine may increase your risk of getting an infection. Tell your doctor or health care professional if you are around anyone with measles or chickenpox, or if you develop sores or blisters that do not heal properly. If you are going to have surgery, tell your doctor or health care professional that you have taken this medicine within the last twelve months. Ask your doctor or health care professional about your diet. You may need to lower the amount of salt you eat. This medicine may increase blood sugar. Ask your healthcare provider if changes in diet or medicines are needed if you have diabetes. What side effects may I notice from receiving this medicine? Side effects that you should report to your doctor or health care professional as soon as possible:  allergic reactions like skin rash, itching or hives, swelling of the face, lips, or tongue  changes in emotions or moods  eye pain   signs and symptoms of high blood sugar such as being more thirsty or hungry or having to urinate more than normal. You may also feel very tired or have blurry vision.  signs and symptoms of infection like fever or chills; cough; sore throat; pain or trouble passing urine  slow growth in children (if used for longer periods of time)  swelling of ankles, feet  trouble sleeping  weak bones (if used for longer periods of time) Side effects that usually do not require medical attention (report to your doctor or health care  professional if they continue or are bothersome):  nausea  skin problems, acne, thin and shiny skin  upset stomach  weight gain This list may not describe all possible side effects. Call your doctor for medical advice about side effects. You may report side effects to FDA at 1-800-FDA-1088. Where should I keep my medicine? Keep out of the reach of children. Store at room temperature between 15 and 30 degrees C (59 and 86 degrees F). Keep container tightly closed. Throw away any unused medicine after the expiration date. NOTE: This sheet is a summary. It may not cover all possible information. If you have questions about this medicine, talk to your doctor, pharmacist, or health care provider.  2020 Elsevier/Gold Standard (2018-07-16 10:30:56) Clindamycin capsules What is this medicine? CLINDAMYCIN (KLIN da MYE sin) is a lincosamide antibiotic. It is used to treat certain kinds of bacterial infections. It will not work for colds, flu, or other viral infections. This medicine may be used for other purposes; ask your health care provider or pharmacist if you have questions. COMMON BRAND NAME(S): Cleocin What should I tell my health care provider before I take this medicine? They need to know if you have any of these conditions:  kidney disease  liver disease  stomach problems like colitis  an unusual or allergic reaction to clindamycin, lincomycin, or other medicines, foods, dyes like tartrazine or preservatives  pregnant or trying to get pregnant  breast-feeding How should I use this medicine? Take this medicine by mouth with a full glass of water. Follow the directions on the prescription label. You can take this medicine with food or on an empty stomach. If the medicine upsets your stomach, take it with food. Take your medicine at regular intervals. Do not take your medicine more often than directed. Take all of your medicine as directed even if you think your are better. Do not  skip doses or stop your medicine early. Talk to your pediatrician regarding the use of this medicine in children. Special care may be needed. Overdosage: If you think you have taken too much of this medicine contact a poison control center or emergency room at once. NOTE: This medicine is only for you. Do not share this medicine with others. What if I miss a dose? If you miss a dose, take it as soon as you can. If it is almost time for your next dose, take only that dose. Do not take double or extra doses. What may interact with this medicine?  birth control pills  erythromycin  medicines that relax muscles for surgery  rifampin This list may not describe all possible interactions. Give your health care provider a list of all the medicines, herbs, non-prescription drugs, or dietary supplements you use. Also tell them if you smoke, drink alcohol, or use illegal drugs. Some items may interact with your medicine. What should I watch for while using this medicine? Tell your doctor or health care provider if your symptoms do not start to get better or if they get worse. This medicine may cause serious skin reactions. They can happen weeks to months after starting the medicine. Contact your health care provider right away if you notice fevers or flu-like symptoms with a rash. The rash may be red or purple and then turn into blisters or peeling of the skin. Or, you might notice a red rash with swelling of the face, lips or lymph nodes in your neck or under your arms. Do not treat diarrhea with over the counter products. Contact your doctor if you have diarrhea that lasts more than 2 days or if it is severe and watery. What side effects may I notice from receiving this medicine? Side effects that you should report to your doctor or health care professional as soon as possible:  allergic reactions like skin rash, itching or hives, swelling of the face, lips, or tongue  dark urine  pain on  swallowing  rash, fever, and swollen lymph nodes  redness, blistering, peeling or loosening of the skin, including inside the mouth  unusual bleeding or bruising  unusually weak or tired  yellowing of eyes or skin Side effects that usually do not require medical attention (report to your doctor or health care professional if they continue or are bothersome):  diarrhea  itching in the rectal or genital area  joint pain  nausea, vomiting  stomach pain This list may not describe all possible side effects. Call your doctor for medical advice about side effects. You may report side effects to FDA at 1-800-FDA-1088. Where should I keep my medicine? Keep out of the reach of children. Store at room temperature between 20 and 25 degrees C (68 and 77 degrees F). Throw away any  unused medicine after the expiration date. NOTE: This sheet is a summary. It may not cover all possible information. If you have questions about this medicine, talk to your doctor, pharmacist, or health care provider.  2020 Elsevier/Gold Standard (2019-01-14 12:02:12) Tonsillitis  Tonsillitis is an infection of the throat. This infection causes the tonsils to become red, tender, and swollen. Tonsils are tissues in the back of your throat. If bacteria caused your infection, antibiotic medicine will be given to you. Sometimes, symptoms of this infection can be treated with the use of medicines that lessen swelling (steroids). If your tonsillitis is very bad (severe) and happens often, you may need to get your tonsils removed (tonsillectomy). Follow these instructions at home: Medicines  Take over-the-counter and prescription medicines only as told by your doctor.  If you were prescribed an antibiotic, take it as told by your doctor. Do not stop taking the antibiotic even if you start to feel better. Eating and drinking  Drink enough fluid to keep your pee (urine) clear or pale yellow.  While your throat is sore,  eat soft or liquid foods like: ? Soup. ? Sherbert. ? Instant breakfast drinks.  Drink warm fluids.  Eat frozen ice pops. General instructions  Rest as much as possible and get plenty of sleep.  Gargle with a salt-water mixture 3-4 times a day or as needed. To make a salt-water mixture, completely dissolve -1 tsp of salt in 1 cup of warm water.  Wash your hands often with soap and water. If there is no soap and water, use hand sanitizer.  Do not share cups, bottles, or other utensils until your symptoms are gone.  Do not smoke. If you need help quitting, ask your doctor.  Keep all follow-up visits as told by your doctor. This is important. Contact a doctor if:  You have large, tender lumps in your neck.  You have a fever that does not go away after 2-3 days.  You have a rash.  You cough up green, yellow-Guerin, or bloody fluid.  You cannot swallow liquids or food for 24 hours.  Only one of your tonsils is swollen. Get help right away if:  You have any new symptoms such as: ? Vomiting. ? Very bad headache. ? Stiff neck. ? Chest pain. ? Trouble breathing or swallowing.  You have very bad throat pain and you also have drooling or voice changes.  You have very bad pain that is not helped by medicine.  You cannot fully open your mouth.  You have redness, swelling, or severe pain anywhere in your neck. Summary  Tonsillitis causes your tonsils to be red, tender, and swollen.  While your throat is sore, eat soft or liquid foods.  Gargle with a salt-water mixture 3-4 times a day or as needed.  Do not share cups, bottles, or other utensils until your symptoms are gone. This information is not intended to replace advice given to you by your health care provider. Make sure you discuss any questions you have with your health care provider. Document Revised: 09/26/2017 Document Reviewed: 11/19/2016 Elsevier Patient Education  2020 Else[employer] Inc. Sore Throat When you  have a sore throat, your throat may feel:  Tender.  Burning.  Irritated.  Scratchy.  Painful when you swallow.  Painful when you talk. Many things can cause a sore throat, such as:  An infection.  Allergies.  Dry air.  Smoke or pollution.  Radiation treatment.  Gastroesophageal reflux disease (GERD).  A tumor. A  sore throat can be the first sign of another sickness. It can happen with other problems, like:  Coughing.  Sneezing.  Fever.  Swelling in the neck. Most sore throats go away without treatment. Follow these instructions at home:      Take over-the-counter medicines only as told by your doctor. ? If your child has a sore throat, do not give your child aspirin.  Drink enough fluids to keep your pee (urine) pale yellow.  Rest when you feel you need to.  To help with pain: ? Sip warm liquids, such as broth, herbal tea, or warm water. ? Eat or drink cold or frozen liquids, such as frozen ice pops. ? Gargle with a salt-water mixture 3-4 times a day or as needed. To make a salt-water mixture, add -1 tsp (3-6 g) of salt to 1 cup (237 mL) of warm water. Mix it until you cannot see the salt anymore. ? Suck on hard candy or throat lozenges. ? Put a cool-mist humidifier in your bedroom at night. ? Sit in the bathroom with the door closed for 5-10 minutes while you run hot water in the shower.  Do not use any products that contain nicotine or tobacco, such as cigarettes, e-cigarettes, and chewing tobacco. If you need help quitting, ask your doctor.  Wash your hands well and often with soap and water. If soap and water are not available, use hand sanitizer. Contact a doctor if:  You have a fever for more than 2-3 days.  You keep having symptoms for more than 2-3 days.  Your throat does not get better in 7 days.  You have a fever and your symptoms suddenly get worse.  Your child who is 3 months to 24 years old has a temperature of 102.20F (39C) or  higher. Get help right away if:  You have trouble breathing.  You cannot swallow fluids, soft foods, or your saliva.  You have swelling in your throat or neck that gets worse.  You keep feeling sick to your stomach (nauseous).  You keep throwing up (vomiting). Summary  A sore throat is pain, burning, irritation, or scratchiness in the throat. Many things can cause a sore throat.  Take over-the-counter medicines only as told by your doctor. Do not give your child aspirin.  Drink plenty of fluids, and rest as needed.  Contact a doctor if your symptoms get worse or your sore throat does not get better within 7 days. This information is not intended to replace advice given to you by your health care provider. Make sure you discuss any questions you have with your health care provider. Document Revised: 03/16/2018 Document Reviewed: 03/16/2018 Elsevier Patient Education  2020 Elsevier Inc. Infectious Mononucleosis Infectious mononucleosis is an infection that is caused by a virus. This illness is often called "mono." It can spread from person to person (is contagious). Mono is usually not serious. It often goes away in 2-4 weeks without treatment. In rare cases, the illness can be bad and last longer. What are the causes? This condition is caused by the Epstein-Barr virus. This virus spreads through:  Contact with a sick person's saliva or other body fluids. This can happen through: ? Kissing. ? Having sex. ? Coughing. ? Sneezing.  Sharing forks, spoons, knives (utensils), or drinking glasses with a person who is sick.  Receiving blood from a person who has mono (blood transfusion).  Receiving an organ from a person who has mono (organ transplant). What increases the risk? You  are more likely to develop this condition if:  You are 31-63 years old. What are the signs or symptoms? You may get symptoms 4-6 weeks after infection. Symptoms may start slowly and happen at different  times. Common symptoms include:  Sore throat.  Headache.  Being very tired (fatigued).  Pain in the muscles.  Swollen glands.  Fever.  No desire for food.  Rash. Other symptoms include:  A liver or spleen that is larger than normal.  Feeling sick to your stomach (nauseous).  Throwing up (vomiting).  Pain in the belly (abdomen). How is this treated? There is no cure for this condition. Mono usually goes away on its own with time. Treatment can help relieve symptoms and may include:  Taking medicines to treat pain and fever.  Drinking plenty of fluids.  Getting a lot of rest.  Taking medicines to treat swelling (corticosteroids). In some very bad cases, treatment may have to be given in a hospital. Follow these instructions at home: Medicines  Take over-the-counter and prescription medicines only as told by your doctor.  Do not take ampicillin or amoxicillin. This may cause a rash.  Do not take aspirin if you are under 18. Activity  Rest as needed.  Do not do any of the following activities until your doctor says that they are safe for you: ? Contact sports. You may need to wait at least 1 month before you play sports. ? Exercise that uses a lot of energy. ? Lifting heavy things.  Slowly go back to your normal activities after your fever is gone, or when your doctor says that you can. Be sure to rest when you get tired. General instructions   Avoid kissing or sharing forks, spoons, knives, or drinking cups until your doctor says that you can.  Drink enough fluid to keep your pee (urine) pale yellow.  Do not drink alcohol.  If you have a sore throat: ? Rinse your mouth (gargle) with salt water 3-4 times a day or as needed. To make salt water, dissolve -1 tsp (3-6 g) of salt in 1 cup (237 mL) of warm water. ? Eat soft foods. Cold foods such as ice cream or ice pops can help your throat feel better. ? Try sucking on hard candy.  Wash your hands often  with soap and water. If you cannot use soap and water, use hand sanitizer.  Keep all follow-up visits as told by your doctor. This is important. How is this prevented?   Avoid contact with people who have mono. A person who has mono may not seem sick, but he or she can still spread the virus.  Avoid sharing forks, spoons, knives, drinking cups, or toothbrushes.  Wash your hands often with soap and water. If you cannot use soap and water, use hand sanitizer.  Use the inside of your elbow to cover your mouth when you cough or sneeze. Contact a doctor if:  Your fever is not gone after 10 days.  You have swelling by your jaw or neck (swollen lymph nodes), and the swelling does not go away after 4 weeks.  Your activity level is not back to normal after 2 months.  Your skin or the white parts of your eyes turn yellow (jaundice).  You have trouble pooping (constipation). This may mean that you: ? Poop (have a bowel movement) fewer times in a week than normal. ? Have a hard time pooping. ? Have poop that is dry, hard, or bigger than normal.  Get help right away if:  You have very bad pain in your: ? Belly. ? Shoulder.  You are drooling.  You have trouble swallowing.  You have trouble breathing.  You have a stiff neck.  You have a very bad headache.  You cannot stop throwing up.  You have jerky movements that you cannot control (seizures).  You are mixed up (confused).  You have trouble with balance.  Your nose or gums start to bleed.  You have signs of not having enough water in your body (dehydration). These may include: ? Weakness. ? Sunken eyes. ? Pale skin. ? Dry mouth. ? Fast breathing or heartbeat. Summary  Infectious mononucleosis, or "mono," is an infection that is caused by a virus.  Mono is usually not serious, but some people may need to be treated for it in the hospital.  You should not play contact sports or lift heavy things until your doctor says  that you can.  Wash your hands often with soap and water. If you cannot use soap and water, use hand sanitizer. This information is not intended to replace advice given to you by your health care provider. Make sure you discuss any questions you have with your health care provider. Document Revised: 07/29/2018 Document Reviewed: 07/29/2018 Elsevier Patient Education  2020 [employer].

## 2019-12-07 ENCOUNTER — Encounter: Payer: Self-pay | Admitting: Adult Health

## 2019-12-08 ENCOUNTER — Telehealth: Payer: Self-pay

## 2019-12-08 NOTE — Telephone Encounter (Signed)
Non-Urgent Medical Question   Berniece Pap, FNP  You 6 hours ago (9:33 AM)   Verify with him, if he has any respiratory symptoms, he was diagnosed with mono at urgent care. If he is good to come to office he should schedule an in person visit.  He can try Benadryl 25mg  tonight for rash to see if any improvement.  Verify no fever, pain, abdominal pain, nausea, vomiting, trouble swallowing.    Message text

## 2019-12-08 NOTE — Telephone Encounter (Signed)
Attempted to reach pt, left VM for pt to CB 

## 2019-12-08 NOTE — Telephone Encounter (Signed)
Attempted to reach pt. x2 , left VM to CB. Also left message from M. Flinchum regarding benadryl 25 mg recommendation. Advised if receives message after 7pm, CB in AM for appt.

## 2019-12-08 NOTE — Telephone Encounter (Signed)
LMTCB, if patient calls office back okay to  Triage patient to see if he is having any respiratory symptoms, if not then patient can be seen in office, otherwise a virtual visit would be appropriate. KW

## 2019-12-08 NOTE — Telephone Encounter (Signed)
Attempted to reach pt, left VM to call back. 

## 2019-12-09 NOTE — Telephone Encounter (Signed)
Needs note sent if no call back today

## 2019-12-09 NOTE — Telephone Encounter (Signed)
Sent message patient through my chart, see chart. KW

## 2019-12-10 ENCOUNTER — Encounter: Payer: Self-pay | Admitting: Adult Health

## 2019-12-10 ENCOUNTER — Other Ambulatory Visit: Payer: Self-pay

## 2019-12-10 ENCOUNTER — Ambulatory Visit (INDEPENDENT_AMBULATORY_CARE_PROVIDER_SITE_OTHER): Payer: BC Managed Care – PPO | Admitting: Adult Health

## 2019-12-10 VITALS — BP 110/70 | HR 65 | Temp 97.1°F | Resp 16 | Wt 143.0 lb

## 2019-12-10 DIAGNOSIS — R21 Rash and other nonspecific skin eruption: Secondary | ICD-10-CM | POA: Diagnosis not present

## 2019-12-10 DIAGNOSIS — R5383 Other fatigue: Secondary | ICD-10-CM

## 2019-12-10 DIAGNOSIS — B2790 Infectious mononucleosis, unspecified without complication: Secondary | ICD-10-CM | POA: Diagnosis not present

## 2019-12-10 MED ORDER — CETIRIZINE HCL 10 MG PO TABS: 10.0000 mg | ORAL_TABLET | Freq: Every day | ORAL | 0 refills | Status: DC

## 2019-12-10 MED ORDER — PREDNISONE 10 MG (21) PO TBPK: ORAL_TABLET | ORAL | 0 refills | Status: DC

## 2019-12-10 NOTE — Progress Notes (Signed)
Patient: Christopher Lyons Male    DOB: 1996-03-16   24 y.o.   MRN: [id number] Visit Date: 12/10/2019  Today's Provider: Marcille Buffy, FNP   Chief Complaint  Patient presents with  . Rash   Subjective:     Rash This is a new problem. The current episode started 1 to 4 weeks ago. The problem has been gradually worsening since onset. The affected locations include the face, neck, chest, torso, back, left hand, right hand, right lower leg and left lower leg. The rash is characterized by redness and itchiness. He was exposed to nothing. Associated symptoms include fatigue. Pertinent negatives include no anorexia, congestion, cough, diarrhea, eye pain, facial edema, fever, joint pain, nail changes, rhinorrhea, shortness of breath, sore throat or vomiting. (Diagnosed with Mono ) Past treatments include antihistamine and oral steroids.   He has taken Benadryl occasionally.  Was treated with clindamycin for sore throat, with exudate and fever by virtual visit, given unable to swab in office for bacteria. Suspected possible co-infection as he had a mono test positive at urgent care 11/20/2019. Symptoms were present one week prior to that date and he reports they were worsening. Resolved with steroid and antibiotics. He is here today concerned about rash that is persisting.   Patient  denies any fever, body aches,chills, chest pain, shortness of breath, nausea, vomiting, or diarrhea. Denies dizziness, lightheadedness, he has not had any abdominal pain he reports.   Denies any new medications, detergents or exposures.   No Known Allergies  No current outpatient medications on file.  Review of Systems  Constitutional: Positive for fatigue. Negative for fever.  HENT: Negative for congestion, rhinorrhea and sore throat.   Eyes: Negative for pain.  Respiratory: Negative for cough and shortness of breath.   Gastrointestinal: Negative for anorexia, diarrhea and vomiting.    Musculoskeletal: Negative for joint pain.  Skin: Positive for rash. Negative for nail changes.    Social History   Tobacco Use  . Smoking status: Never Smoker  . Smokeless tobacco: Never Used  Substance Use Topics  . Alcohol use: Not on file      Objective:   BP 110/70   Pulse 65   Temp (!) 97.1 F (36.2 C) (Oral)   Resp 16   Wt 143 lb (64.9 kg)   BMI 25.33 kg/m  Vitals:   12/10/19 1018  BP: 110/70  Pulse: 65  Resp: 16  Temp: (!) 97.1 F (36.2 C)  TempSrc: Oral  Weight: 143 lb (64.9 kg)  Body mass index is 25.33 kg/m.   Physical Exam Vitals reviewed.  Constitutional:      General: He is not in acute distress.    Appearance: Normal appearance. He is not ill-appearing, toxic-appearing or diaphoretic.  HENT:     Head: Normocephalic and atraumatic.     Jaw: There is normal jaw occlusion.     Right Ear: Hearing, tympanic membrane, ear canal and external ear normal. There is no impacted cerumen.     Left Ear: Hearing, tympanic membrane, ear canal and external ear normal. There is no impacted cerumen.     Nose: Nose normal. No congestion or rhinorrhea.     Right Sinus: No maxillary sinus tenderness.     Left Sinus: No maxillary sinus tenderness or frontal sinus tenderness.     Mouth/Throat:     Lips: Pink.     Mouth: Mucous membranes are moist.     Tongue: No lesions. Tongue does  not deviate from midline.     Pharynx: Oropharynx is clear. Uvula midline. No pharyngeal swelling, oropharyngeal exudate, posterior oropharyngeal erythema or uvula swelling.     Tonsils: No tonsillar exudate or tonsillar abscesses. 1+ on the right. 1+ on the left.     Comments: All symptoms resolved since last visit . Eyes:     General: No scleral icterus.       Right eye: No discharge.        Left eye: No discharge.     Pupils: Pupils are equal, round, and reactive to light.  Neck:     Vascular: No carotid bruit.  Cardiovascular:     Rate and Rhythm: Normal rate and regular rhythm.      Pulses: Normal pulses.     Heart sounds: Normal heart sounds. No murmur. No friction rub. No gallop.   Pulmonary:     Effort: Pulmonary effort is normal. No respiratory distress.     Breath sounds: Normal breath sounds. No stridor. No wheezing, rhonchi or rales.  Chest:     Chest wall: No tenderness.  Abdominal:     General: There is no distension.     Palpations: Abdomen is soft. There is no mass.     Tenderness: There is no abdominal tenderness. There is no right CVA tenderness, left CVA tenderness, guarding or rebound.     Hernia: No hernia is present.     Comments: Abdominal exam within normal limits.   Musculoskeletal:        General: Normal range of motion.     Cervical back: Normal range of motion. No rigidity or tenderness.  Lymphadenopathy:     Cervical: Cervical adenopathy present.     Right cervical: Superficial cervical adenopathy present.     Left cervical: Superficial cervical adenopathy (shotty nodes bilateral, non tender ) present.  Skin:    General: Skin is warm and dry.     Capillary Refill: Capillary refill takes less than 2 seconds.     Findings: Rash (generalized sporatic diffuse maculopapular rash face, back and arms - resolving since onset. ) present.     Comments: No warmth or drainage with rash. Scratch marks noted on back.  No petechiae   Neurological:     General: No focal deficit present.     Mental Status: He is alert and oriented to person, place, and time. Mental status is at baseline.     Motor: No weakness.     Gait: Gait normal.  Psychiatric:        Mood and Affect: Mood normal.        Behavior: Behavior normal.        Thought Content: Thought content normal.        Judgment: Judgment normal.      No results found for any visits on 12/10/19.     Assessment & Plan    1. Infectious mononucleosis without complication, infectious mononucleosis due to unspecified organism Discussed normal resolution of rash typically resolves on its on.  No signs of infection.   2. Rash  Meds ordered this encounter  Medications  . cetirizine (ZYRTEC ALLERGY) 10 MG tablet    Sig: Take 1 tablet (10 mg total) by mouth daily.    Dispense:  30 tablet    Refill:  0  . predniSONE (STERAPRED UNI-PAK 21 TAB) 10 MG (21) TBPK tablet    Sig: PO: Take 6 tablets on day 1:Take 5 tablets day 2:Take 4 tablets day 3: Take  3 tablets day 4:Take 2 tablets day five: 5 Take 1 tablet day 6    Dispense:  21 tablet    Refill:  0   He was requesting another mono test and antibodies however he then declined and those orders were canceled.   3. Fatigue, unspecified type Discussed etiology of Mononucleosis and gave education, answered all questions.  The goal of mono treatment is to ease the symptoms while the immune system contains the virus. Antibiotics (which are used to treat bacterial infections) are not helpful because a virus causes mono. There are no antiviral medications that are known to treat or cure Epstein-Barr virus effectively.  Pain and fever--Sore throat, muscle aches, and fever can be treated with nonprescription medications, such as acetaminophen (sample brand name: Tylenol) or ibuprofen (sample brand names: Motrin, Advil). Acetaminophen is broken down by the liver. Thus, it is important to closely follow the dosing instructions or your health care provider's instructions to safely take this medication. Acetaminophen and ibuprofen are also recommended for use in children.  Rest--Mono can cause severe fatigue, although most people recover within two to four weeks. For some, significant tiredness lasts for weeks to months. Early in the infection, it is important to get adequate rest, although complete bed rest is not necessary. Diet--Feeling ill often causes a loss of appetite. This is normal and usually improves as the infection resolves. It is important, even if you have no appetite, to drink an adequate amount of fluids. This is especially true if  you are taking ibuprofen for pain or fever because ibuprofen can affect kidney function if you become dehydrated. You are drinking adequate fluids if your urine is a pale yellow color. WHEN CAN I GO BACK TO WORK OR SCHOOL?--Many people with mono develop an enlarged spleen, which can last for a few weeks or longer. Although you can return to school or work when you are feeling better, it's important to avoid activities that can cause injury to the spleen. Experts generally recommend that athletes not participate in contact or vigorous sport activities for at least the first three to four weeks of the illness. Your health care provider should determine when it is safe for you to participate in strenuous activities or contact sports.  Patient verbalized understanding of all instructions given and denies any further questions at this time.   Advised patient call the office or your primary care doctor for an appointment if no improvement within 72 hours or if any symptoms change or worsen at any time  Advised ER or urgent Care if after hours or on weekend. Call 911 for emergency symptoms at any time.Patinet verbalized understanding of all instructions given/reviewed and treatment plan and has no further questions or concerns at this time.    Return if symptoms worsen or fail to improve, for at any time for any worsening symptoms, Go to Emergency room/ urgent care if worse.   Addressed acute medical problems today requiring 32  minutes reviewing his medical record, counseling patient regarding his conditions and coordination of care. Patient had mulltiple questions regarding diagnosis, treatment and recovery.    The entirety of the information documented in the History of Present Illness, Review of Systems and Physical Exam were personally obtained by me. Portions of this information were initially documented by the  Certified Medical Assistant whose name is documented in Epic and reviewed by me for  thoroughness and accuracy.  I have personally performed the exam and reviewed the chart and it is  accurate to the best of my knowledge.  Museum/gallery conservator has been used and any errors in dictation or transcription are unintentional.    Jairo Ben, FNP  [facility]

## 2019-12-10 NOTE — Patient Instructions (Signed)
Infectious Mononucleosis Infectious mononucleosis is an infection that is caused by a virus. This illness is often called "mono." It can spread from person to person (is contagious). Mono is usually not serious. It often goes away in 2-4 weeks without treatment. In rare cases, the illness can be bad and last longer. What are the causes? This condition is caused by the Epstein-Barr virus. This virus spreads through:  Contact with a sick person's saliva or other body fluids. This can happen through: ? Kissing. ? Having sex. ? Coughing. ? Sneezing.  Sharing forks, spoons, knives (utensils), or drinking glasses with a person who is sick.  Receiving blood from a person who has mono (blood transfusion).  Receiving an organ from a person who has mono (organ transplant). What increases the risk? You are more likely to develop this condition if:  You are 11-50 years old. What are the signs or symptoms? You may get symptoms 4-6 weeks after infection. Symptoms may start slowly and happen at different times. Common symptoms include:  Sore throat.  Headache.  Being very tired (fatigued).  Pain in the muscles.  Swollen glands.  Fever.  No desire for food.  Rash. Other symptoms include:  A liver or spleen that is larger than normal.  Feeling sick to your stomach (nauseous).  Throwing up (vomiting).  Pain in the belly (abdomen). How is this treated? There is no cure for this condition. Mono usually goes away on its own with time. Treatment can help relieve symptoms and may include:  Taking medicines to treat pain and fever.  Drinking plenty of fluids.  Getting a lot of rest.  Taking medicines to treat swelling (corticosteroids). In some very bad cases, treatment may have to be given in a hospital. Follow these instructions at home: Medicines  Take over-the-counter and prescription medicines only as told by your doctor.  Do not take ampicillin or amoxicillin. This may  cause a rash.  Do not take aspirin if you are under 18. Activity  Rest as needed.  Do not do any of the following activities until your doctor says that they are safe for you: ? Contact sports. You may need to wait at least 1 month before you play sports. ? Exercise that uses a lot of energy. ? Lifting heavy things.  Slowly go back to your normal activities after your fever is gone, or when your doctor says that you can. Be sure to rest when you get tired. General instructions   Avoid kissing or sharing forks, spoons, knives, or drinking cups until your doctor says that you can.  Drink enough fluid to keep your pee (urine) pale yellow.  Do not drink alcohol.  If you have a sore throat: ? Rinse your mouth (gargle) with salt water 3-4 times a day or as needed. To make salt water, dissolve -1 tsp (3-6 g) of salt in 1 cup (237 mL) of warm water. ? Eat soft foods. Cold foods such as ice cream or ice pops can help your throat feel better. ? Try sucking on hard candy.  Wash your hands often with soap and water. If you cannot use soap and water, use hand sanitizer.  Keep all follow-up visits as told by your doctor. This is important. How is this prevented?   Avoid contact with people who have mono. A person who has mono may not seem sick, but he or she can still spread the virus.  Avoid sharing forks, spoons, knives, drinking cups, or toothbrushes.  Wash your hands often with soap and water. If you cannot use soap and water, use hand sanitizer.  Use the inside of your elbow to cover your mouth when you cough or sneeze. Contact a doctor if:  Your fever is not gone after 10 days.  You have swelling by your jaw or neck (swollen lymph nodes), and the swelling does not go away after 4 weeks.  Your activity level is not back to normal after 2 months.  Your skin or the white parts of your eyes turn yellow (jaundice).  You have trouble pooping (constipation). This may mean that  you: ? Poop (have a bowel movement) fewer times in a week than normal. ? Have a hard time pooping. ? Have poop that is dry, hard, or bigger than normal. Get help right away if:  You have very bad pain in your: ? Belly. ? Shoulder.  You are drooling.  You have trouble swallowing.  You have trouble breathing.  You have a stiff neck.  You have a very bad headache.  You cannot stop throwing up.  You have jerky movements that you cannot control (seizures).  You are mixed up (confused).  You have trouble with balance.  Your nose or gums start to bleed.  You have signs of not having enough water in your body (dehydration). These may include: ? Weakness. ? Sunken eyes. ? Pale skin. ? Dry mouth. ? Fast breathing or heartbeat. Summary  Infectious mononucleosis, or "mono," is an infection that is caused by a virus.  Mono is usually not serious, but some people may need to be treated for it in the hospital.  You should not play contact sports or lift heavy things until your doctor says that you can.  Wash your hands often with soap and water. If you cannot use soap and water, use hand sanitizer. This information is not intended to replace advice given to you by your health care provider. Make sure you discuss any questions you have with your health care provider. Document Revised: 07/29/2018 Document Reviewed: 07/29/2018 Elsevier Patient Education  2020 [employer]  

## 2019-12-11 LAB — COMPREHENSIVE METABOLIC PANEL
ALT: 45 IU/L — ABNORMAL HIGH (ref 0–44)
AST: 26 IU/L (ref 0–40)
Albumin/Globulin Ratio: 1.6 (ref 1.2–2.2)
Albumin: 4.7 g/dL (ref 4.1–5.2)
Alkaline Phosphatase: 61 IU/L (ref 39–117)
BUN/Creatinine Ratio: 14 (ref 9–20)
BUN: 15 mg/dL (ref 6–20)
Bilirubin Total: 0.5 mg/dL (ref 0.0–1.2)
CO2: 22 mmol/L (ref 20–29)
Calcium: 10 mg/dL (ref 8.7–10.2)
Chloride: 100 mmol/L (ref 96–106)
Creatinine, Ser: 1.06 mg/dL (ref 0.76–1.27)
GFR calc Af Amer: 114 mL/min/{1.73_m2} (ref >59)
GFR calc non Af Amer: 98 mL/min/{1.73_m2} (ref >59)
Globulin, Total: 2.9 g/dL (ref 1.5–4.5)
Glucose: 81 mg/dL (ref 65–99)
Potassium: 4.4 mmol/L (ref 3.5–5.2)
Sodium: 140 mmol/L (ref 134–144)
Total Protein: 7.6 g/dL (ref 6.0–8.5)

## 2019-12-11 LAB — CBC WITH DIFFERENTIAL/PLATELET
Basophils Absolute: 0.1 10*3/uL (ref 0.0–0.2)
Basos: 1 %
EOS (ABSOLUTE): 0.1 10*3/uL (ref 0.0–0.4)
Eos: 1 %
Hematocrit: 44 % (ref 37.5–51.0)
Hemoglobin: 14.8 g/dL (ref 13.0–17.7)
Immature Grans (Abs): 0 10*3/uL (ref 0.0–0.1)
Immature Granulocytes: 1 %
Lymphocytes Absolute: 3.3 10*3/uL — ABNORMAL HIGH (ref 0.7–3.1)
Lymphs: 52 %
MCH: 29.4 pg (ref 26.6–33.0)
MCHC: 33.6 g/dL (ref 31.5–35.7)
MCV: 87 fL (ref 79–97)
Monocytes Absolute: 0.5 10*3/uL (ref 0.1–0.9)
Monocytes: 8 %
Neutrophils Absolute: 2.4 10*3/uL (ref 1.4–7.0)
Neutrophils: 37 %
Platelets: 277 10*3/uL (ref 150–450)
RBC: 5.04 x10E6/uL (ref 4.14–5.80)
RDW: 12.6 % (ref 11.6–15.4)
WBC: 6.4 10*3/uL (ref 3.4–10.8)

## 2019-12-13 NOTE — Progress Notes (Signed)
CBC - white blood cell are normal, no anemia. Very transient increase in lymphocytes related to mono infection.  CMP within normal limits no concern. No further work up or treatment needed. Keep plan as discussed at last office visit and return as needed.  EBV/ Mono studies patient previously requested were not performed as patient declined when he got to the lab.

## 2020-01-04 ENCOUNTER — Encounter: Payer: Self-pay | Admitting: Adult Health

## 2020-01-05 ENCOUNTER — Telehealth: Payer: Self-pay | Admitting: Adult Health

## 2020-01-05 DIAGNOSIS — M25562 Pain in left knee: Secondary | ICD-10-CM

## 2020-01-05 DIAGNOSIS — G8929 Other chronic pain: Secondary | ICD-10-CM

## 2020-01-05 NOTE — Telephone Encounter (Signed)
Orders Placed This Encounter  Procedures  . Ambulatory referral to Orthopedic Surgery  [facility] will [facility] pain, Apply a compressive ACE bandage. Rest and elevate the affected painful area.  Apply cold compresses intermittently as needed.  Patient has been advised to rest left knee until evaluated at [facility] orthopedics walk in clinic at 1pm today 01/05/2020.

## 2020-02-10 ENCOUNTER — Ambulatory Visit: Payer: BC Managed Care – PPO | Attending: Family

## 2020-02-10 DIAGNOSIS — Z23 Encounter for immunization: Secondary | ICD-10-CM

## 2020-02-10 NOTE — Progress Notes (Signed)
   Covid-19 Vaccination Clinic  Name:  Christopher Lyons    MRN: [id number] DOB: 1995-12-06  02/10/2020  Mr. [name] was observed post Covid-19 immunization for 15 minutes without incident. He was provided with Vaccine Information Sheet and instruction to access the V-Safe system.   Mr. [name] was instructed to call 911 with any severe reactions post vaccine: Marland Kitchen Difficulty breathing  . Swelling of face and throat  . A fast heartbeat  . A bad rash all over body  . Dizziness and weakness   Immunizations Administered    Name Date Dose VIS Date Route   Moderna COVID-19 Vaccine 02/10/2020  3:21 PM 0.5 mL 09/28/2019 Intramuscular   Manufacturer: Moderna   Lot: [id number]   NDC: 72620-355-97

## 2020-03-14 ENCOUNTER — Ambulatory Visit: Payer: BC Managed Care – PPO | Attending: Family

## 2020-03-14 DIAGNOSIS — Z23 Encounter for immunization: Secondary | ICD-10-CM

## 2020-03-14 NOTE — Progress Notes (Signed)
   Covid-19 Vaccination Clinic  Name:  Christopher Lyons    MRN: [id number] DOB: 08/26/1996  03/14/2020  [name] was observed post Covid-19 immunization for 15 minutes without incident. He was provided with Vaccine Information Sheet and instruction to access the V-Safe system.   [name] was instructed to call 911 with any severe reactions post vaccine: Marland Kitchen Difficulty breathing  . Swelling of face and throat  . A fast heartbeat  . A bad rash all over body  . Dizziness and weakness   Immunizations Administered    Name Date Dose VIS Date Route   Moderna COVID-19 Vaccine 03/14/2020  3:07 PM 0.5 mL 09/2019 Intramuscular   Manufacturer: Moderna   Lot: [id number]   NDC: 94000-505-67

## 2020-05-10 ENCOUNTER — Telehealth: Payer: Self-pay

## 2020-05-10 ENCOUNTER — Encounter: Payer: Self-pay | Admitting: Adult Health

## 2020-05-10 NOTE — Telephone Encounter (Signed)
Copied from CRM [id number]. Topic: General - Other >> May 10, 2020  4:47 PM Dalphine Handing A wrote: Patient would like a callback to know what is due for his upcoming appointment next week.

## 2020-05-15 ENCOUNTER — Ambulatory Visit (INDEPENDENT_AMBULATORY_CARE_PROVIDER_SITE_OTHER): Payer: BC Managed Care – PPO | Admitting: Physician Assistant

## 2020-05-15 ENCOUNTER — Ambulatory Visit
Admission: RE | Admit: 2020-05-15 | Discharge: 2020-05-15 | Disposition: A | Discharge status: Home / Self Care | Payer: BC Managed Care – PPO | Attending: Physician Assistant | Admitting: Physician Assistant

## 2020-05-15 ENCOUNTER — Encounter: Payer: Self-pay | Admitting: Physician Assistant

## 2020-05-15 ENCOUNTER — Other Ambulatory Visit: Payer: Self-pay

## 2020-05-15 ENCOUNTER — Ambulatory Visit
Admission: RE | Admit: 2020-05-15 | Discharge: 2020-05-15 | Disposition: A | Discharge status: Home / Self Care | Payer: BC Managed Care – PPO | Source: Ambulatory Visit | Attending: Physician Assistant | Admitting: Physician Assistant

## 2020-05-15 VITALS — BP 113/71 | HR 60 | Temp 97.2°F | Resp 16 | Ht 63.0 in | Wt 150.0 lb

## 2020-05-15 DIAGNOSIS — R0602 Shortness of breath: Secondary | ICD-10-CM | POA: Diagnosis not present

## 2020-05-15 DIAGNOSIS — R0781 Pleurodynia: Secondary | ICD-10-CM

## 2020-05-15 DIAGNOSIS — Z6826 Body mass index (BMI) 26.0-26.9, adult: Secondary | ICD-10-CM

## 2020-05-15 MED ORDER — ALBUTEROL SULFATE HFA 108 (90 BASE) MCG/ACT IN AERS: 2.0000 | INHALATION_SPRAY | Freq: Four times a day (QID) | RESPIRATORY_TRACT | 0 refills | Status: AC | PRN

## 2020-05-15 NOTE — Patient Instructions (Addendum)
Voltaren Gel (diclofenac gel)   Chest Wall Pain Chest wall pain is pain in or around the bones and muscles of your chest. Sometimes, an injury causes this pain. Excessive coughing or overuse of arm and chest muscles may also cause chest wall pain. Sometimes, the cause may not be known. This pain may take several weeks or longer to get better. Follow these instructions at home: Managing pain, stiffness, and swelling   If directed, put ice on the painful area: ? Put ice in a plastic bag. ? Place a towel between your skin and the bag. ? Leave the ice on for 20 minutes, 2-3 times per day. Activity  Rest as told by your health care provider.  Avoid activities that cause pain. These include any activities that use your chest muscles or your abdominal and side muscles to lift heavy items. Ask your health care provider what activities are safe for you. General instructions   Take over-the-counter and prescription medicines only as told by your health care provider.  Do not use any products that contain nicotine or tobacco, such as cigarettes, e-cigarettes, and chewing tobacco. These can delay healing after injury. If you need help quitting, ask your health care provider.  Keep all follow-up visits as told by your health care provider. This is important. Contact a health care provider if:  You have a fever.  Your chest pain becomes worse.  You have new symptoms. Get help right away if:  You have nausea or vomiting.  You feel sweaty or light-headed.  You have a cough with mucus from your lungs (sputum) or you cough up blood.  You develop shortness of breath. These symptoms may represent a serious problem that is an emergency. Do not wait to see if the symptoms will go away. Get medical help right away. Call your local emergency services (911 in the [place] Do not drive yourself to the hospital. Summary  Chest wall pain is pain in or around the bones and muscles of your  chest.  Depending on the cause, it may be treated with ice, rest, medicines, and avoiding activities that cause pain.  Contact a health care provider if you have a fever, worsening chest pain, or new symptoms.  Get help right away if you feel light-headed or you develop shortness of breath. These symptoms may be an emergency. This information is not intended to replace advice given to you by your health care provider. Make sure you discuss any questions you have with your health care provider. Document Revised: 04/16/2018 Document Reviewed: 04/16/2018 Elsevier Patient Education  2020 [employer]  Exercise-Induced Bronchoconstriction, Adult  Exercise-induced bronchospasm (EIB) happens when the airways narrow during or after vigorous activity or exercise. The airways are the passages that lead from the nose and mouth down into the lungs. When the airways narrow, this can cause coughing, wheezing, and shortness of breath. This makes it hard to breathe. Anyone can develop EIB, even people who do not have allergies or asthma. With proper treatment, most people with EIB can be active and exercise normally. What are the causes? The exact cause of this condition is not known. Symptoms are brought on (triggered) by physical activity. EIB can also be triggered by:  Breathing very cold and dry or hot and humid air.  Chemicals, such as chlorine in swimming pools, pesticides, or fertilizers.  Outdoor triggers, such as: Dentist pollution. ? Car exhaust. ? Pollen from grass, trees, or flowers. ? Campfire smoke.  Indoor triggers, such  as: ? Dust. ? Mold. ? Tobacco smoke. ? Cleaning solutions. ? Animal dander. What increases the risk? You are more likely to develop this condition if you:  Have asthma.  Participate in sports that require constant motion, such as basketball, hockey, skiing, and swimming.  Work outdoors.  Exercise where there are higher levels of one or more EIB  triggers. What are the signs or symptoms? Symptoms of this condition include:  Coughing.  Wheezing.  Shortness of breath.  Chest pain or tightness.  Sore throat.  Upset stomach. Symptoms may worsen after exercise has stopped. How is this diagnosed? EIB is diagnosed with your medical history and a physical exam. You may also have other tests, including:  Lung function studies (spirometry).  An exercise test to check for EIB symptoms.  Allergy tests. How is this treated? Treatment for EIB includes preventing symptoms when possible, and treating EIB quickly when symptoms occur. Treatment may include:  Taking medicine that your health care provider prescribes. Medicine comes in different forms, including: ? Medicines that you breathe in (inhale). These include:  Steroids. These help to control your symptoms and are usually taken every day.  Quick relief medicines. These help to quickly relieve your breathing difficulty. ? Medicines that you take by mouth (orally). These help to control allergies and asthma.  Avoiding triggers.  Stopping physical activity or exercise to rest. Follow these instructions at home:  Take over-the-counter and prescription medicines only as told by your health care provider.  Do not use products that contain nicotine or tobacco, such as cigarettes, e-cigarettes, and chewing tobacco. If you need help quitting, ask your health care provider.  Make changes in your workout as told by your health care provider. Exercise is important to your health and well-being. ? Keep quick relief medicine with you when you are exercising. ? Tell your exercise partners about your condition. Wear a medical ID bracelet. ? If you are planning to exercise alone or in an isolated area, let someone know where you are going and when you will be back.  You may need to see a health care provider who specializes in allergies (allergist) or the lungs (pulmonologist) for more  tests.  Keep all follow-up visits as told by your health care provider. This is important. How is this prevented?  Take medicines to prevent exercise-induced bronchospasm as told by your health care provider.  Warm up before starting to play sports or exercise.  Exercise indoors to avoid outdoor triggers.  Cover your nose and mouth with a scarf to warm air that is very cold.  Tell your workout partners or trainer about your condition. Tell them how to help you if you have an episode. Contact a health care provider if:  You have coughing, wheezing, or shortness of breath that continues after treatment.  Your coughing wakes you up at night.  You have less endurance than you used to. Get help right away if:  Your medicine is not helping you breathe better.  You cannot catch your breath.  You pass out. Summary  Exercise-induced bronchospasm (EIB) happens when the airways narrow during or after exercise.  When the airways narrow, this can cause coughing, wheezing, and shortness of breath. It can be difficult to breathe.  Take over-the-counter and prescription medicines only as told by your health care provider.  Make changes in your workout as told by your health care provider.  Contact a health care provider if you continue to have trouble breathing after treatment.  This information is not intended to replace advice given to you by your health care provider. Make sure you discuss any questions you have with your health care provider. Document Revised: 07/02/2018 Document Reviewed: 07/07/2018 Elsevier Patient Education  2020 [employer].

## 2020-05-15 NOTE — Progress Notes (Signed)
Established patient visit   Patient: Christopher Lyons   DOB: 10-31-1995   24 y.o. Male  MRN: [id number] Visit Date: 05/15/2020  Today's healthcare provider: Margaretann Loveless, PA-C   Chief Complaint  Patient presents with  . Chest Pain   Subjective    HPI  Christopher Lyons is a 24 y.o male with concerns of left rib pain,reports it feels behind his rib cage like if it is his "lung". Reports that he had COVID-17 July 2019. He also had mono end of January. He had his covid vaccines done 02/10/20 and 03/14/20. He is training for [employer] and reports that he feels like a tightness in his left rib cage. He also has SOB. Reports that before this hasn't happened when he has trained or run a marathon previously.  Patient Active Problem List   Diagnosis Date Noted  . SOBOE (shortness of breath on exertion) 05/17/2020  . Rib pain on left side 05/17/2020  . BMI 26.0-26.9,adult 05/17/2020  . Rash 12/10/2019  . Infectious mononucleosis without complication 12/10/2019  . Throat pain 11/23/2019  . Acute tonsillitis 11/23/2019  . History of mononucleosis 11/23/2019  . Fatigue 11/12/2019  . Screening for thyroid disorder 11/12/2019  . Eye dryness 11/12/2019  . Peroneus brevis tendinitis, right 11/30/2014  . Right foot pain 11/30/2014  . Muscle tightness 07/06/2014  . Bilateral calf pain 07/06/2014  . Decreased range of motion of ankle 07/06/2014   History reviewed. No pertinent past medical history.     Medications: Outpatient Medications Prior to Visit  Medication Sig  . [DISCONTINUED] cetirizine (ZYRTEC ALLERGY) 10 MG tablet Take 1 tablet (10 mg total) by mouth daily. (Patient not taking: Reported on 05/15/2020)  . [DISCONTINUED] predniSONE (STERAPRED UNI-PAK 21 TAB) 10 MG (21) TBPK tablet PO: Take 6 tablets on day 1:Take 5 tablets day 2:Take 4 tablets day 3: Take 3 tablets day 4:Take 2 tablets day five: 5 Take 1 tablet day 6   No facility-administered medications prior to visit.     Review of Systems  Constitutional: Negative.   HENT: Negative for congestion.   Respiratory: Positive for chest tightness and shortness of breath.   Cardiovascular: Negative for chest pain, palpitations and leg swelling.  Neurological: Negative for dizziness, light-headedness and headaches.    Last CBC Lab Results  Component Value Date   WBC 6.4 12/10/2019   HGB 14.8 12/10/2019   HCT 44.0 12/10/2019   MCV 87 12/10/2019   MCH 29.4 12/10/2019   RDW 12.6 12/10/2019   PLT 277 12/10/2019   Last metabolic panel Lab Results  Component Value Date   GLUCOSE 81 12/10/2019   NA 140 12/10/2019   K 4.4 12/10/2019   CL 100 12/10/2019   CO2 22 12/10/2019   BUN 15 12/10/2019   CREATININE 1.06 12/10/2019   GFRNONAA 98 12/10/2019   GFRAA 114 12/10/2019   CALCIUM 10.0 12/10/2019   PROT 7.6 12/10/2019   ALBUMIN 4.7 12/10/2019   LABGLOB 2.9 12/10/2019   AGRATIO 1.6 12/10/2019   BILITOT 0.5 12/10/2019   ALKPHOS 61 12/10/2019   AST 26 12/10/2019   ALT 45 (H) 12/10/2019      Objective    BP 113/71 (BP Location: Left Arm, Patient Position: Sitting, Cuff Size: Normal)   Pulse 60   Temp (!) 97.2 F (36.2 C) (Temporal)   Resp 16   Ht 5\' 3"  (1.6 m)   Wt 150 lb (68 kg)   BMI 26.57 kg/m  BP Readings from Last  3 Encounters:  05/15/20 113/71  12/10/19 110/70  11/12/19 118/84   Wt Readings from Last 3 Encounters:  05/15/20 150 lb (68 kg)  12/10/19 143 lb (64.9 kg)  11/12/19 147 lb 3.2 oz (66.8 kg)      Physical Exam Vitals reviewed.  Constitutional:      General: He is not in acute distress.    Appearance: He is well-developed and normal weight. He is not ill-appearing or diaphoretic.  HENT:     Head: Normocephalic and atraumatic.  Eyes:     Extraocular Movements: Extraocular movements intact.     Pupils: Pupils are equal, round, and reactive to light.  Neck:     Thyroid: No thyromegaly.     Vascular: No JVD.     Trachea: No tracheal deviation.  Cardiovascular:      Rate and Rhythm: Normal rate and regular rhythm.     Heart sounds: Normal heart sounds. No murmur heard.  No friction rub. No gallop.   Pulmonary:     Effort: Pulmonary effort is normal. No respiratory distress.     Breath sounds: Normal breath sounds. No decreased breath sounds, wheezing or rales.  Chest:     Chest wall: Tenderness present.    Musculoskeletal:     Cervical back: Normal range of motion and neck supple.  Lymphadenopathy:     Cervical: No cervical adenopathy.  Neurological:     Mental Status: He is alert.       No results found for any visits on 05/15/20.  Assessment & Plan     Problem List Items Addressed This Visit      Other   SOBOE (shortness of breath on exertion) - Primary    New Possibly may be new onset exercise induced asthma from recent respiratory infections Will get xray to evaluate for abnormality or atelectasis Will give trial of albuterol inhaler to use with exercise. If improves may consider pulmonology referral for exercise spirometry Call if worsening or not responding to treatment plan      Relevant Medications   albuterol (VENTOLIN HFA) 108 (90 Base) MCG/ACT inhaler   Other Relevant Orders   DG Chest 2 View (Completed)   Rib pain on left side    Xray ordered F/U pending results.      Relevant Medications   albuterol (VENTOLIN HFA) 108 (90 Base) MCG/ACT inhaler   Other Relevant Orders   DG Chest 2 View (Completed)   BMI 26.0-26.9,adult    Counseled patient on healthy lifestyle modifications including dieting and exercise.          Return if symptoms worsen or fail to improve.      Delmer Islam, PA-C, have reviewed all documentation for this visit. The documentation on 05/17/20 for the exam, diagnosis, procedures, and orders are all accurate and complete.   Reine Just  [facility] [phone] (phone) [phone] (fax)  [facility]

## 2020-05-15 NOTE — Telephone Encounter (Signed)
Patient being seen in office, can follow up PRN for acute matter such as fatigue. KW

## 2020-05-16 ENCOUNTER — Telehealth: Payer: Self-pay

## 2020-05-16 NOTE — Telephone Encounter (Signed)
Pt advised.   Thanks,   -Veva Grimley  

## 2020-05-16 NOTE — Telephone Encounter (Signed)
-----   Message from Margaretann Loveless, PA-C sent at 05/16/2020 10:40 AM EDT ----- CXR appears completely normal. Lungs are clear. No bony abnormality noted.

## 2020-05-17 DIAGNOSIS — R0602 Shortness of breath: Secondary | ICD-10-CM | POA: Insufficient documentation

## 2020-05-17 DIAGNOSIS — Z6826 Body mass index (BMI) 26.0-26.9, adult: Secondary | ICD-10-CM | POA: Insufficient documentation

## 2020-05-17 DIAGNOSIS — R0781 Pleurodynia: Secondary | ICD-10-CM | POA: Insufficient documentation

## 2020-05-17 NOTE — Assessment & Plan Note (Signed)
New Possibly may be new onset exercise induced asthma from recent respiratory infections Will get xray to evaluate for abnormality or atelectasis Will give trial of albuterol inhaler to use with exercise. If improves may consider pulmonology referral for exercise spirometry Call if worsening or not responding to treatment plan

## 2020-05-17 NOTE — Assessment & Plan Note (Signed)
Xray ordered F/U pending results.

## 2020-05-17 NOTE — Assessment & Plan Note (Signed)
Counseled patient on healthy lifestyle modifications including dieting and exercise.  °

## 2020-09-18 ENCOUNTER — Telehealth: Payer: Self-pay | Admitting: *Deleted

## 2020-09-18 DIAGNOSIS — S8264XA Nondisplaced fracture of lateral malleolus of right fibula, initial encounter for closed fracture: Secondary | ICD-10-CM | POA: Diagnosis not present

## 2020-09-18 NOTE — Telephone Encounter (Signed)
Needs office visit or virtual, he can also go to [facility] for evaluation now 1pm to 7pm they can do an x ray while there.

## 2020-09-18 NOTE — Telephone Encounter (Signed)
Copied from CRM [id number]. Topic: General - Other >> Sep 18, 2020  1:02 PM Marylen Ponto wrote: Reason for CRM: Pt requests an order for x-ray of right ankle. Pt requests call back

## 2020-09-18 NOTE — Telephone Encounter (Signed)
Called and spoke with patient who states that the other day he was playing basketball and believes that he possibly rolled or fractured his ankle. Patient reports swelling and bruising at ankle and difficulty bearing weight. I advised patient that an office visit would be recommended for evaluation and treatment. Patient declined office visit stating that he just wanted a xray. Patient states that he had a previous fracture to right ankle a year ago and states that it seems similar now.

## 2020-09-18 NOTE — Telephone Encounter (Signed)
Patient has been advised and states that he will contact [facility] this afternoon. KW

## 2020-11-13 ENCOUNTER — Encounter: Payer: Self-pay | Admitting: Adult Health

## 2020-12-05 IMAGING — CR DG CHEST 2V
1 series · 2 of 2 positions shown · non-contrast
Comparison: None.

CLINICAL DATA: Shortness of breath history of COVID

EXAM:
CHEST - 2 VIEW

[Series 1: dg chest 2 view · 0.14mm/px · 2 of 2 slices shown]
[im 1/2]
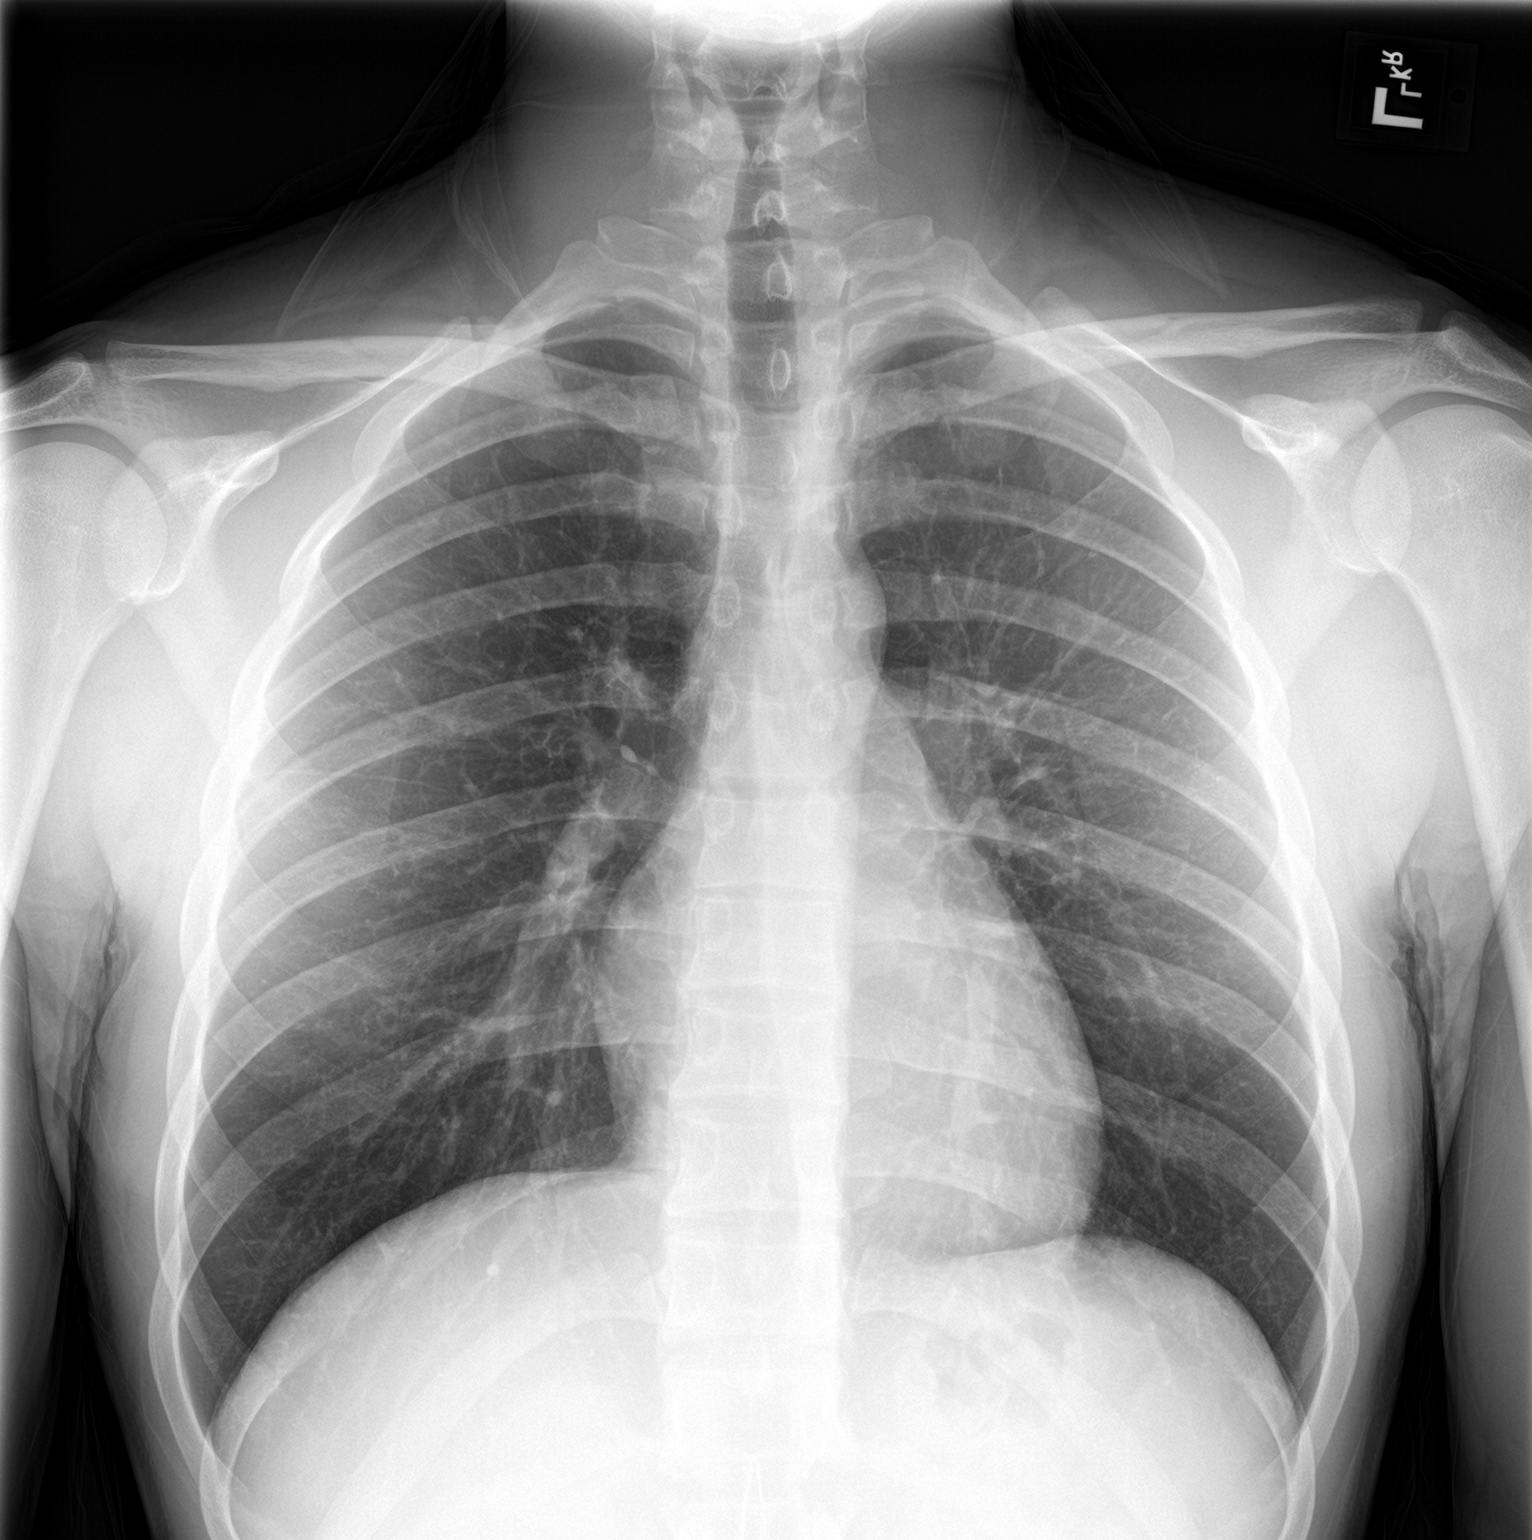
[im 2/2]
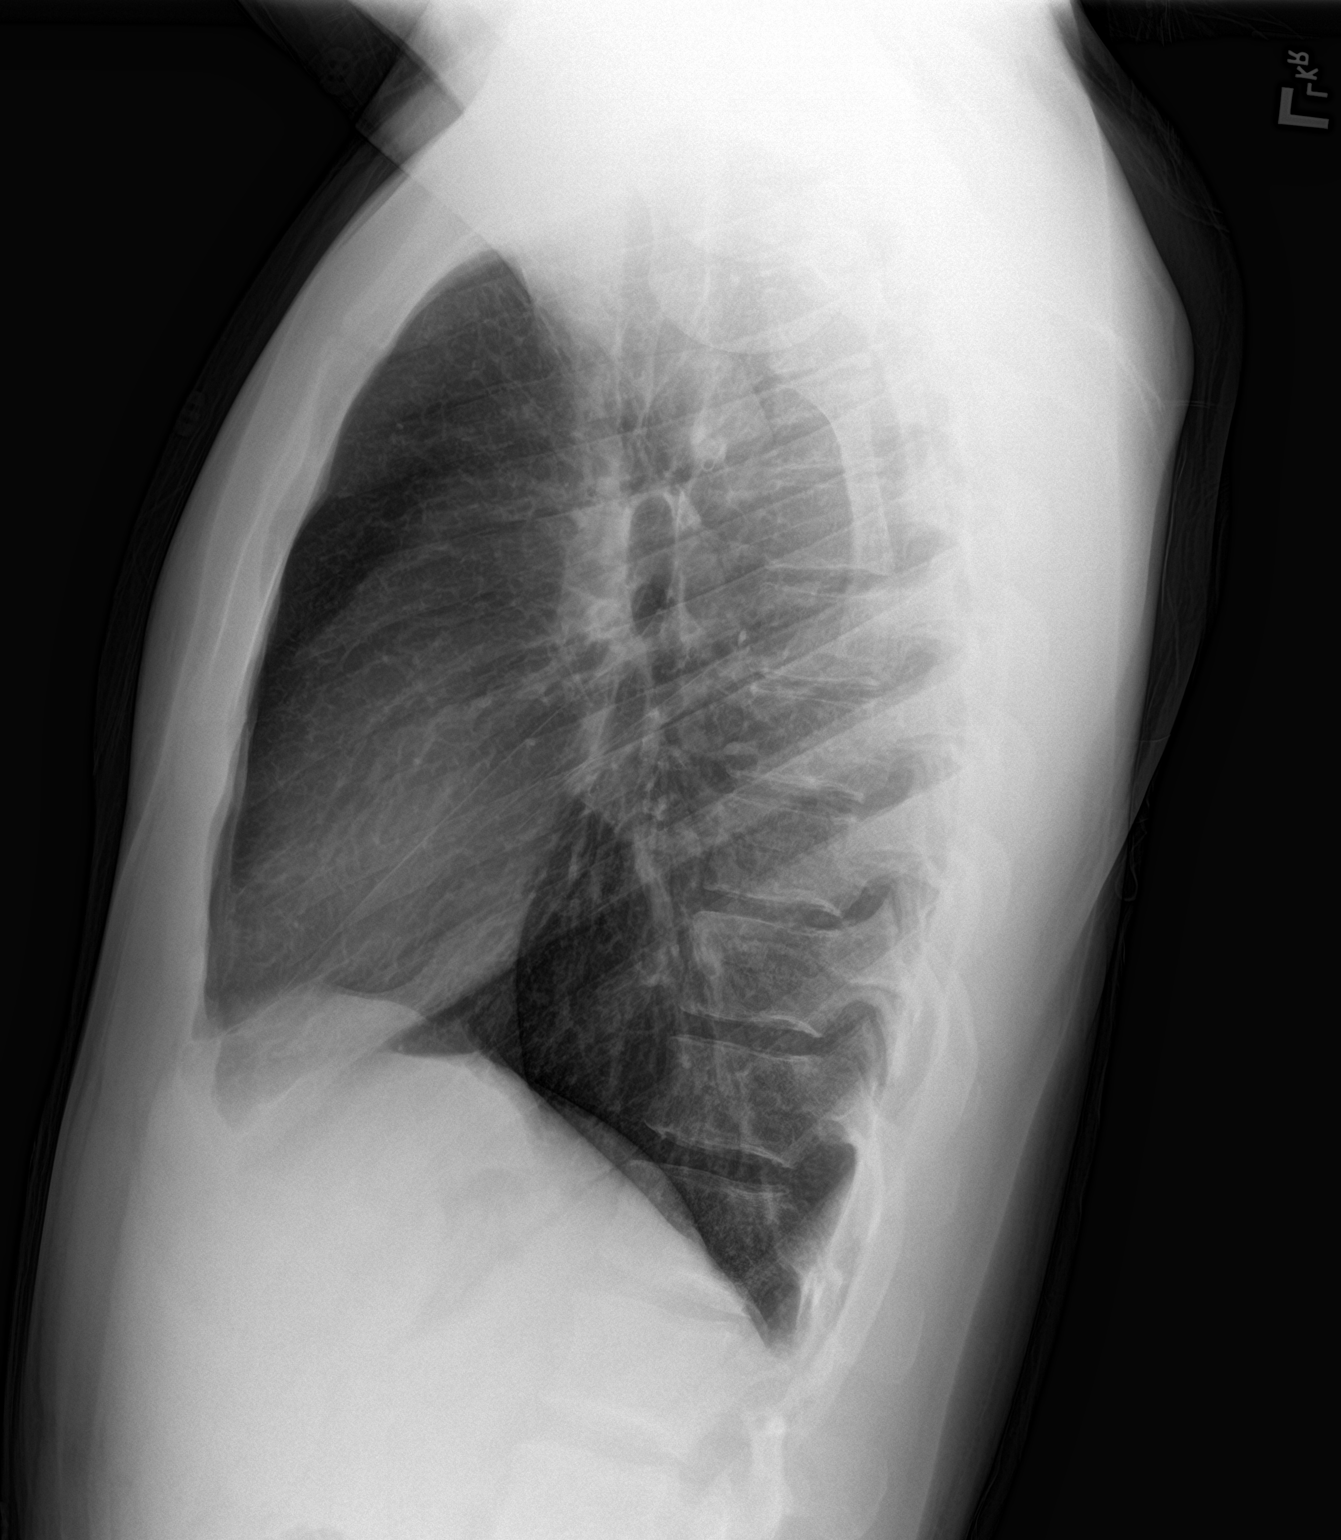

[2 of 2 positions shown; findings below may reference images not displayed]

FINDINGS: The heart size and mediastinal contours are within normal limits.
Both lungs are clear. The visualized skeletal structures are
unremarkable.
IMPRESSION: No active cardiopulmonary disease.
